# Patient Record
Sex: Male | Born: 1980 | Race: Black or African American | Hispanic: No | Marital: Married | Smoking: Never smoker
Health system: Southern US, Community
[De-identification: ages and names within clinical notes are randomized; demographics above are authoritative.]

## PROBLEM LIST (undated history)

## (undated) DIAGNOSIS — I10 Essential (primary) hypertension: Secondary | ICD-10-CM

## (undated) DIAGNOSIS — R569 Unspecified convulsions: Secondary | ICD-10-CM

## (undated) DIAGNOSIS — L03116 Cellulitis of left lower limb: Secondary | ICD-10-CM

## (undated) HISTORY — PX: NO PAST SURGERIES: SHX2092

## (undated) HISTORY — DX: Essential (primary) hypertension: I10

## (undated) HISTORY — DX: Cellulitis of left lower limb: L03.116

---

## 2004-05-14 ENCOUNTER — Emergency Department (HOSPITAL_COMMUNITY): Admission: EM | Admit: 2004-05-14 | Discharge: 2004-05-15 | Payer: Self-pay | Admitting: Emergency Medicine

## 2004-06-20 ENCOUNTER — Emergency Department (HOSPITAL_COMMUNITY): Admission: EM | Admit: 2004-06-20 | Discharge: 2004-06-21 | Payer: Self-pay | Admitting: Emergency Medicine

## 2004-06-25 ENCOUNTER — Emergency Department (HOSPITAL_COMMUNITY): Admission: EM | Admit: 2004-06-25 | Discharge: 2004-06-25 | Payer: Self-pay | Admitting: Emergency Medicine

## 2004-07-04 ENCOUNTER — Emergency Department (HOSPITAL_COMMUNITY): Admission: EM | Admit: 2004-07-04 | Discharge: 2004-07-04 | Payer: Self-pay | Admitting: Emergency Medicine

## 2004-07-24 ENCOUNTER — Emergency Department (HOSPITAL_COMMUNITY): Admission: EM | Admit: 2004-07-24 | Discharge: 2004-07-24 | Payer: Self-pay | Admitting: Emergency Medicine

## 2004-08-25 ENCOUNTER — Emergency Department (HOSPITAL_COMMUNITY): Admission: EM | Admit: 2004-08-25 | Discharge: 2004-08-25 | Payer: Self-pay | Admitting: Emergency Medicine

## 2004-10-29 ENCOUNTER — Emergency Department (HOSPITAL_COMMUNITY): Admission: EM | Admit: 2004-10-29 | Discharge: 2004-10-29 | Payer: Self-pay | Admitting: Emergency Medicine

## 2005-05-17 ENCOUNTER — Emergency Department (HOSPITAL_COMMUNITY): Admission: EM | Admit: 2005-05-17 | Discharge: 2005-05-18 | Payer: Self-pay | Admitting: Emergency Medicine

## 2005-08-05 ENCOUNTER — Emergency Department (HOSPITAL_COMMUNITY): Admission: EM | Admit: 2005-08-05 | Discharge: 2005-08-05 | Payer: Self-pay | Admitting: Emergency Medicine

## 2005-08-11 ENCOUNTER — Inpatient Hospital Stay (HOSPITAL_COMMUNITY): Admission: EM | Admit: 2005-08-11 | Discharge: 2005-08-12 | Payer: Self-pay | Admitting: *Deleted

## 2005-08-11 ENCOUNTER — Encounter: Payer: Self-pay | Admitting: Vascular Surgery

## 2005-10-24 ENCOUNTER — Emergency Department (HOSPITAL_COMMUNITY): Admission: EM | Admit: 2005-10-24 | Discharge: 2005-10-24 | Payer: Self-pay | Admitting: Emergency Medicine

## 2005-11-24 ENCOUNTER — Inpatient Hospital Stay (HOSPITAL_COMMUNITY): Admission: EM | Admit: 2005-11-24 | Discharge: 2005-11-25 | Payer: Self-pay | Admitting: Emergency Medicine

## 2005-12-26 ENCOUNTER — Emergency Department (HOSPITAL_COMMUNITY): Admission: EM | Admit: 2005-12-26 | Discharge: 2005-12-27 | Payer: Self-pay | Admitting: Emergency Medicine

## 2006-01-16 ENCOUNTER — Emergency Department (HOSPITAL_COMMUNITY): Admission: EM | Admit: 2006-01-16 | Discharge: 2006-01-16 | Payer: Self-pay | Admitting: Emergency Medicine

## 2006-02-10 ENCOUNTER — Emergency Department (HOSPITAL_COMMUNITY): Admission: EM | Admit: 2006-02-10 | Discharge: 2006-02-11 | Payer: Self-pay | Admitting: Emergency Medicine

## 2006-03-21 ENCOUNTER — Inpatient Hospital Stay (HOSPITAL_COMMUNITY): Admission: EM | Admit: 2006-03-21 | Discharge: 2006-03-23 | Payer: Self-pay | Admitting: Emergency Medicine

## 2006-03-21 ENCOUNTER — Emergency Department (HOSPITAL_COMMUNITY): Admission: EM | Admit: 2006-03-21 | Discharge: 2006-03-22 | Payer: Self-pay | Admitting: Emergency Medicine

## 2006-04-09 ENCOUNTER — Emergency Department (HOSPITAL_COMMUNITY): Admission: EM | Admit: 2006-04-09 | Discharge: 2006-04-09 | Payer: Self-pay | Admitting: Emergency Medicine

## 2006-07-28 ENCOUNTER — Emergency Department (HOSPITAL_COMMUNITY): Admission: EM | Admit: 2006-07-28 | Discharge: 2006-07-28 | Payer: Self-pay | Admitting: Emergency Medicine

## 2006-08-28 ENCOUNTER — Emergency Department (HOSPITAL_COMMUNITY): Admission: EM | Admit: 2006-08-28 | Discharge: 2006-08-28 | Payer: Self-pay | Admitting: Emergency Medicine

## 2007-01-07 ENCOUNTER — Emergency Department (HOSPITAL_COMMUNITY): Admission: EM | Admit: 2007-01-07 | Discharge: 2007-01-07 | Payer: Self-pay | Admitting: Emergency Medicine

## 2007-01-08 ENCOUNTER — Emergency Department (HOSPITAL_COMMUNITY): Admission: EM | Admit: 2007-01-08 | Discharge: 2007-01-08 | Payer: Self-pay

## 2007-02-09 ENCOUNTER — Ambulatory Visit: Payer: Self-pay | Admitting: Vascular Surgery

## 2007-02-09 ENCOUNTER — Inpatient Hospital Stay (HOSPITAL_COMMUNITY): Admission: EM | Admit: 2007-02-09 | Discharge: 2007-02-13 | Payer: Self-pay | Admitting: *Deleted

## 2007-02-09 ENCOUNTER — Encounter (INDEPENDENT_AMBULATORY_CARE_PROVIDER_SITE_OTHER): Payer: Self-pay | Admitting: Emergency Medicine

## 2007-02-09 ENCOUNTER — Ambulatory Visit: Payer: Self-pay | Admitting: Family Medicine

## 2007-02-15 ENCOUNTER — Telehealth: Payer: Self-pay | Admitting: *Deleted

## 2007-02-22 ENCOUNTER — Telehealth (INDEPENDENT_AMBULATORY_CARE_PROVIDER_SITE_OTHER): Payer: Self-pay | Admitting: Family Medicine

## 2007-02-22 ENCOUNTER — Telehealth: Payer: Self-pay | Admitting: Family Medicine

## 2007-02-23 ENCOUNTER — Ambulatory Visit: Payer: Self-pay | Admitting: Internal Medicine

## 2007-02-23 DIAGNOSIS — I82409 Acute embolism and thrombosis of unspecified deep veins of unspecified lower extremity: Secondary | ICD-10-CM | POA: Insufficient documentation

## 2007-02-23 LAB — CONVERTED CEMR LAB: INR: 1.2

## 2007-02-26 ENCOUNTER — Ambulatory Visit: Payer: Self-pay | Admitting: Family Medicine

## 2007-03-06 ENCOUNTER — Ambulatory Visit: Payer: Self-pay | Admitting: Family Medicine

## 2007-03-06 DIAGNOSIS — G40909 Epilepsy, unspecified, not intractable, without status epilepticus: Secondary | ICD-10-CM

## 2007-03-16 ENCOUNTER — Ambulatory Visit: Payer: Self-pay | Admitting: Family Medicine

## 2007-03-16 LAB — CONVERTED CEMR LAB: INR: 1.8

## 2007-03-26 ENCOUNTER — Emergency Department (HOSPITAL_COMMUNITY): Admission: EM | Admit: 2007-03-26 | Discharge: 2007-03-26 | Payer: Self-pay | Admitting: Emergency Medicine

## 2007-03-30 ENCOUNTER — Encounter (INDEPENDENT_AMBULATORY_CARE_PROVIDER_SITE_OTHER): Payer: Self-pay | Admitting: *Deleted

## 2007-05-30 ENCOUNTER — Emergency Department (HOSPITAL_COMMUNITY): Admission: EM | Admit: 2007-05-30 | Discharge: 2007-05-30 | Payer: Self-pay | Admitting: Emergency Medicine

## 2007-06-27 ENCOUNTER — Emergency Department (HOSPITAL_COMMUNITY): Admission: EM | Admit: 2007-06-27 | Discharge: 2007-06-27 | Payer: Self-pay | Admitting: Emergency Medicine

## 2007-07-10 ENCOUNTER — Inpatient Hospital Stay (HOSPITAL_COMMUNITY): Admission: EM | Admit: 2007-07-10 | Discharge: 2007-07-13 | Payer: Self-pay | Admitting: Emergency Medicine

## 2007-07-31 ENCOUNTER — Emergency Department (HOSPITAL_COMMUNITY): Admission: EM | Admit: 2007-07-31 | Discharge: 2007-07-31 | Payer: Self-pay | Admitting: Emergency Medicine

## 2007-08-01 ENCOUNTER — Emergency Department (HOSPITAL_COMMUNITY): Admission: EM | Admit: 2007-08-01 | Discharge: 2007-08-02 | Payer: Self-pay | Admitting: Emergency Medicine

## 2007-08-03 ENCOUNTER — Emergency Department (HOSPITAL_COMMUNITY): Admission: EM | Admit: 2007-08-03 | Discharge: 2007-08-04 | Payer: Self-pay | Admitting: Emergency Medicine

## 2007-08-06 ENCOUNTER — Emergency Department (HOSPITAL_COMMUNITY): Admission: EM | Admit: 2007-08-06 | Discharge: 2007-08-07 | Payer: Self-pay | Admitting: Emergency Medicine

## 2007-08-22 ENCOUNTER — Emergency Department (HOSPITAL_COMMUNITY): Admission: EM | Admit: 2007-08-22 | Discharge: 2007-08-22 | Payer: Self-pay | Admitting: Emergency Medicine

## 2007-08-24 ENCOUNTER — Emergency Department (HOSPITAL_COMMUNITY): Admission: EM | Admit: 2007-08-24 | Discharge: 2007-08-24 | Payer: Self-pay | Admitting: Emergency Medicine

## 2007-08-24 ENCOUNTER — Encounter (INDEPENDENT_AMBULATORY_CARE_PROVIDER_SITE_OTHER): Payer: Self-pay | Admitting: Emergency Medicine

## 2007-08-24 ENCOUNTER — Ambulatory Visit: Payer: Self-pay | Admitting: Vascular Surgery

## 2007-09-10 ENCOUNTER — Emergency Department (HOSPITAL_COMMUNITY): Admission: EM | Admit: 2007-09-10 | Discharge: 2007-09-10 | Payer: Self-pay | Admitting: Emergency Medicine

## 2007-12-30 ENCOUNTER — Emergency Department (HOSPITAL_COMMUNITY): Admission: EM | Admit: 2007-12-30 | Discharge: 2007-12-31 | Payer: Self-pay | Admitting: Emergency Medicine

## 2007-12-30 ENCOUNTER — Emergency Department (HOSPITAL_COMMUNITY): Admission: EM | Admit: 2007-12-30 | Discharge: 2007-12-30 | Payer: Self-pay | Admitting: Emergency Medicine

## 2007-12-31 ENCOUNTER — Inpatient Hospital Stay (HOSPITAL_COMMUNITY): Admission: EM | Admit: 2007-12-31 | Discharge: 2008-01-02 | Payer: Self-pay | Admitting: Emergency Medicine

## 2008-01-24 ENCOUNTER — Emergency Department (HOSPITAL_COMMUNITY): Admission: EM | Admit: 2008-01-24 | Discharge: 2008-01-25 | Payer: Self-pay | Admitting: Emergency Medicine

## 2008-02-12 ENCOUNTER — Emergency Department (HOSPITAL_COMMUNITY): Admission: EM | Admit: 2008-02-12 | Discharge: 2008-02-12 | Payer: Self-pay | Admitting: Emergency Medicine

## 2008-02-25 ENCOUNTER — Emergency Department (HOSPITAL_COMMUNITY): Admission: EM | Admit: 2008-02-25 | Discharge: 2008-02-25 | Payer: Self-pay | Admitting: Emergency Medicine

## 2008-03-12 ENCOUNTER — Emergency Department (HOSPITAL_COMMUNITY): Admission: EM | Admit: 2008-03-12 | Discharge: 2008-03-12 | Payer: Self-pay | Admitting: Emergency Medicine

## 2008-03-28 ENCOUNTER — Emergency Department (HOSPITAL_COMMUNITY): Admission: EM | Admit: 2008-03-28 | Discharge: 2008-03-28 | Payer: Self-pay | Admitting: Emergency Medicine

## 2008-04-09 ENCOUNTER — Emergency Department (HOSPITAL_COMMUNITY): Admission: EM | Admit: 2008-04-09 | Discharge: 2008-04-09 | Payer: Self-pay | Admitting: Emergency Medicine

## 2008-04-29 ENCOUNTER — Emergency Department (HOSPITAL_COMMUNITY): Admission: EM | Admit: 2008-04-29 | Discharge: 2008-04-30 | Payer: Self-pay | Admitting: Emergency Medicine

## 2008-05-06 ENCOUNTER — Emergency Department (HOSPITAL_COMMUNITY): Admission: EM | Admit: 2008-05-06 | Discharge: 2008-05-06 | Payer: Self-pay | Admitting: Emergency Medicine

## 2008-06-16 ENCOUNTER — Inpatient Hospital Stay (HOSPITAL_COMMUNITY): Admission: EM | Admit: 2008-06-16 | Discharge: 2008-06-18 | Payer: Self-pay | Admitting: Emergency Medicine

## 2008-08-11 ENCOUNTER — Emergency Department (HOSPITAL_COMMUNITY): Admission: AC | Admit: 2008-08-11 | Discharge: 2008-08-12 | Payer: Self-pay

## 2008-10-19 ENCOUNTER — Emergency Department (HOSPITAL_COMMUNITY): Admission: EM | Admit: 2008-10-19 | Discharge: 2008-10-19 | Payer: Self-pay | Admitting: Emergency Medicine

## 2008-11-21 ENCOUNTER — Emergency Department (HOSPITAL_COMMUNITY): Admission: EM | Admit: 2008-11-21 | Discharge: 2008-11-21 | Payer: Self-pay | Admitting: Emergency Medicine

## 2008-12-08 ENCOUNTER — Emergency Department (HOSPITAL_COMMUNITY): Admission: EM | Admit: 2008-12-08 | Discharge: 2008-12-08 | Payer: Self-pay | Admitting: Emergency Medicine

## 2009-09-07 ENCOUNTER — Emergency Department (HOSPITAL_COMMUNITY): Admission: EM | Admit: 2009-09-07 | Discharge: 2009-09-08 | Payer: Self-pay | Admitting: Emergency Medicine

## 2010-06-04 ENCOUNTER — Emergency Department (HOSPITAL_COMMUNITY)
Admission: EM | Admit: 2010-06-04 | Discharge: 2010-06-04 | Payer: Self-pay | Source: Home / Self Care | Admitting: Emergency Medicine

## 2010-06-07 NOTE — Consult Note (Signed)
NAME:  Calvin, [name] NO.:  [id number]  MEDICAL RECORD NO.:  [id number]          PATIENT TYPE:  EMS  LOCATION:  ED                           FACILITY:  [facility]  PHYSICIAN:  Marlan Palau, M.D.  DATE OF BIRTH:  06/22/1980  DATE OF CONSULTATION:  06/04/2010 DATE OF DISCHARGE:                                CONSULTATION   This is a Neurology emergency room consult note on the patient done on June 04, 2010.  HISTORY OF PRESENT ILLNESS:  Calvin [name] is a 30 year old right-handed black male, born [birth year] with a history of intractable seizures.  This patient has been on several medications in the past for seizures that include Dilantin, Depakote, and carbamazepine.  The patient had been on Keppra as well but was unable to afford the medication and went off the drug.  The patient has been on carbamazepine since February 2010 and had done fairly well on the drug.  The patient had a seizure in July 2011 when he ran out of the medication for a couple of days.  The patient is on 200 mg twice daily.  The patient had not recently missed a dose, but was at work today and had onset of a seizure with generalized jerking.  The patient was brought in for an evaluation.  The patient had been operating a motor vehicle.  The patient claims that he has had blood levels checked of carbamazepine through our office, but I do not see that this is the case.  Blood work has been checked in the emergency room but the results are pending.  PAST MEDICAL HISTORY:  Significant for: 1. History of seizures order with recent recurrence. 2. Medical noncompliance. 3. History of DVT in left leg. 4. Hypertension. 5. History of rhabdomyolysis associated with seizure.  The patient currently is on carbamazepine 200 mg twice daily.  The patient claims that he is not on any other medications at this time. The patient has no known allergies.  Does not smoke or drink.  SOCIAL HISTORY:   [place]; single; does currently work for [employer], lives with a friend.  The patient has no children.  FAMILY MEDICAL HISTORY:  Mother is alive and well.  Father died following a motor vehicle accident.  The patient has three sisters.  No family history of seizures is noted.  REVIEW OF SYSTEMS:  Notable for no recent fevers, chills.  The patient denies headache, neck pain, shortness of breath, chest pains, nausea, vomiting, difficulty controlling the bowels or bladder.  The patient was feeling well otherwise prior to the seizure today.  PHYSICAL EXAMINATION:  VITALS SIGNS:  Blood pressure 116/62, heart rate 98, respiratory rate 20, temperature afebrile. GENERAL:  The patient is a fairly well-developed black male who is alert and cooperative to time and examination. HEENT:  Head is atraumatic.  EYES:  Pupils are equal, round and react to light.  Disks are flat bilaterally. NECK:  Supple.  No carotid bruits noted.  Rest of examination is clear. CARDIOVASCULAR:  Regular rate and rhythm.  No obvious murmurs  or rubs noted. EXTREMITIES:  Without significant edema. NEUROLOGIC:  Cranial nerves as above.  Facial symmetry is present. Patient has full extraocular movements.  No significant nystagmus is seen with end gaze bilaterally.  Visual fields are full.  Speech is normal.  No laceration of the tongue is noted.  The patient has good strength in all fours, has good finger-nose-finger and toe-to-finger bilaterally.  Gait was not tested.  Deep tendon reflexes symmetric and normal, strength is normal throughout.  No drift is seen on the arms.  LABORATORY VALUES:  Unavailable at this time.  Tegretol level is pending.  IMPRESSION:  History of chronic seizure disorder with recent recurrence.  Need to get the carbamazepine levels and readjust the dose accordingly. The patient is not to drive until further notice and will need to  follow- up through our office.  If possible, the carbamazepine levels can be adjusted upwards.  I do not see this patient had a carbamazepine level checked since he has been on the Tegretol.     Marlan Palau, M.D.     CKW/MEDQ  D:  06/04/2010  T:  06/04/2010  Job:  [id number]  cc:   [facility] [address] [place]  Electronically Signed by Thana Farr M.D. on 06/07/2010 07:47:54 AM

## 2010-07-05 ENCOUNTER — Emergency Department (HOSPITAL_COMMUNITY)
Admission: EM | Admit: 2010-07-05 | Discharge: 2010-07-05 | Disposition: A | Discharge status: Home / Self Care | Payer: Self-pay | Attending: Emergency Medicine | Admitting: Emergency Medicine

## 2010-07-05 DIAGNOSIS — G40909 Epilepsy, unspecified, not intractable, without status epilepticus: Secondary | ICD-10-CM | POA: Insufficient documentation

## 2010-08-15 LAB — CARBAMAZEPINE LEVEL, TOTAL: Carbamazepine Lvl: <2 ug/mL — ABNORMAL LOW (ref 4.0–12.0)

## 2010-08-15 LAB — BASIC METABOLIC PANEL
CO2: 21 mEq/L (ref 19–32)
Chloride: 107 mEq/L (ref 96–112)
GFR calc Af Amer: >60 mL/min (ref >60)
Potassium: 4.4 mEq/L (ref 3.5–5.1)
Sodium: 139 mEq/L (ref 135–145)

## 2010-08-24 LAB — URINALYSIS, ROUTINE W REFLEX MICROSCOPIC
Glucose, UA: NEGATIVE mg/dL
Ketones, ur: NEGATIVE mg/dL
Leukocytes, UA: NEGATIVE
Nitrite: NEGATIVE
Specific Gravity, Urine: 1.011 (ref 1.005–1.030)
pH: 5 (ref 5.0–8.0)

## 2010-08-24 LAB — CBC
HCT: 36.5 % — ABNORMAL LOW (ref 39.0–52.0)
HCT: 46.3 % (ref 39.0–52.0)
Hemoglobin: 12.4 g/dL — ABNORMAL LOW (ref 13.0–17.0)
Hemoglobin: 15.3 g/dL (ref 13.0–17.0)
MCV: 90.4 fL (ref 78.0–100.0)
MCV: 92.9 fL (ref 78.0–100.0)
Platelets: 169 10*3/uL (ref 150–400)
Platelets: 230 10*3/uL (ref 150–400)
RBC: 4.41 MIL/uL (ref 4.22–5.81)
RDW: 12.2 % (ref 11.5–15.5)
RDW: 12.7 % (ref 11.5–15.5)
WBC: 5.5 10*3/uL (ref 4.0–10.5)
WBC: 7.3 10*3/uL (ref 4.0–10.5)

## 2010-08-24 LAB — DIFFERENTIAL
Basophils Relative: 0 % (ref 0–1)
Eosinophils Absolute: 0 10*3/uL (ref 0.0–0.7)
Eosinophils Relative: 0 % (ref 0–5)
Monocytes Absolute: 1.1 10*3/uL — ABNORMAL HIGH (ref 0.1–1.0)
Neutro Abs: 3.1 10*3/uL (ref 1.7–7.7)

## 2010-08-24 LAB — BLOOD GAS, ARTERIAL
Bicarbonate: 24.8 mEq/L — ABNORMAL HIGH (ref 20.0–24.0)
O2 Saturation: 88.7 %
Patient temperature: 98.9
TCO2: 22 mmol/L (ref 0–100)
pH, Arterial: 7.364 (ref 7.350–7.450)
pO2, Arterial: 61.4 mmHg — ABNORMAL LOW (ref 80.0–100.0)

## 2010-08-24 LAB — RAPID URINE DRUG SCREEN, HOSP PERFORMED
Barbiturates: NOT DETECTED
Cocaine: NOT DETECTED
Opiates: NOT DETECTED

## 2010-08-24 LAB — BASIC METABOLIC PANEL
BUN: 13 mg/dL (ref 6–23)
BUN: 9 mg/dL (ref 6–23)
Calcium: 8.5 mg/dL (ref 8.4–10.5)
Calcium: 8.6 mg/dL (ref 8.4–10.5)
Chloride: 106 mEq/L (ref 96–112)
Chloride: 109 mEq/L (ref 96–112)
Creatinine, Ser: 1.11 mg/dL (ref 0.4–1.5)
GFR calc Af Amer: >60 mL/min (ref >60)
GFR calc Af Amer: >60 mL/min (ref >60)
GFR calc Af Amer: >60 mL/min (ref >60)
GFR calc non Af Amer: >60 mL/min (ref >60)
GFR calc non Af Amer: >60 mL/min (ref >60)
Glucose, Bld: 97 mg/dL (ref 70–99)
Potassium: 3.5 mEq/L (ref 3.5–5.1)
Potassium: 4 mEq/L (ref 3.5–5.1)
Sodium: 142 mEq/L (ref 135–145)
Sodium: 145 mEq/L (ref 135–145)

## 2010-08-24 LAB — T4, FREE: Free T4: 0.77 ng/dL — ABNORMAL LOW (ref 0.89–1.80)

## 2010-08-24 LAB — URINE MICROSCOPIC-ADD ON

## 2010-08-24 LAB — CK
Total CK: 1199 U/L — ABNORMAL HIGH (ref 7–232)
Total CK: 967 U/L — ABNORMAL HIGH (ref 7–232)

## 2010-08-24 LAB — ETHANOL: Alcohol, Ethyl (B): <5 mg/dL (ref 0–10)

## 2010-08-24 LAB — TSH: TSH: 0.2 u[IU]/mL — ABNORMAL LOW (ref 0.350–4.500)

## 2010-09-21 NOTE — Discharge Summary (Signed)
NAME:  Calvin [name], Calvin [name]               ACCOUNT NO.:  [id number]   MEDICAL RECORD NO.:  [id number]          PATIENT TYPE:  INP   LOCATION:  5017                         FACILITY:  [facility]   PHYSICIAN:  Wayne A. Sheffield Slider, M.D.    DATE OF BIRTH:  [birth year]   DATE OF ADMISSION:  02/09/2007  DATE OF DISCHARGE:  02/13/2007                               DISCHARGE SUMMARY   PRIMARY CARE Dashel Goines:  Consulting civil engineer [facility].   DISCHARGE DIAGNOSES:  1. Deep vein thrombosis of the left lower extremity.  2. Seizure disorder.  3. Cellulitis of the left lower extremity.   DISCHARGE MEDICATIONS:  1. Trimethoprim Sulfamethoxazole double strength 160/80 two pills      twice daily x7 days.  2. Warfarin take as directed at INR check.  3. Depakote ER 500 mg daily.  4. Tegretol XR 400 mg twice daily.   CONSULTS:  Pharmacy anticoagulation.   PROCEDURE:  Lower extremity Doppler on February 09, 2007.   LABORATORY DATA:  INR on February 09, 2007 was 1.1.  On February 10, 2007,  1.3.  On February 11, 2007, 1.7.  On February 12, 2007, it was 2.6.  On  February 13, 2007, the day of discharge, the INR was 2.9 with a  prothrombin time of 31.3.  CBC on discharge was:  White blood count 5.4,  hemoglobin 13.4, hematocrit 39.5, platelets 188,000.  Blood cultures  showed no growth.  Valproic acid level on February 09, 2007 was 15.4.  Tegretol level on February 09, 2007 was 6.3.  These were both drawn at  2125 in the evening.  Antithrombin 100, this was on February 09, 2007.   BRIEF HOSPITAL COURSE:  This is a 30 year old male with a past medical  history of seizure disorder admitted on February 09, 2007 for acute left  DVT with no known precipitating factors.  1. For left lower extremity DVT, he was started on warfarin and      bridged to anticoagulation therapy with Lovenox.  Initial doses      were at 10 mg of warfarin per day and on discharge were 4 mg per      day.  He had no signs or symptoms of pulmonary embolism.  INR was      checked daily and adjusted, and warfarin dose was adjusted to an      INR of 2 to 3.  2. Cellulitis.  The patient was found to have a left lower leg      cellulitis and was stared on vancomycin as an inpatient.  He      received 3 days of vancomycin.  It was discontinued and TMP/FMX was      started on February 12, 2007 to prepare the patient for discharge.      The patient will be discharged on this medication.  Pharmacy had      some concern that this antibiotic in combination with warfarin may      cause supratherapeutic INR.  Therefore, close followup as an      outpatient is important.  They did not consider  this an absolute      contraindication.  3. Seizure disorder.  Continue his Tegretol and Depakote at home      levels.  His Depakote level was low over the weekend.  He will be      scheduled for followup with [facility] as an outpatient.      We will not adjust his seizure meds at this time.   DISCHARGE INSTRUCTIONS:  The patient is discharged with no restrictions  on diet or activity, and is asked to elevate his left leg as much as  possible.  He is to return to the [facility] for  an INR check tomorrow morning anytime during regular business hours  between 8:30 and 5 p.m.  He is also to followup with Dr. Madlyn Frankel.  Constance Goltz at the [facility] on February 22, 2007 at  1:30 p.m.  Followup is currently being arranged with Dr. Pearlean Brownie at  [facility] at phone number [phone].   The patient was discharged to home in stable medical condition.      Romero Belling, MD  Electronically Signed      Arnette Norris. Sheffield Slider, M.D.  Electronically Signed    MO/MEDQ  D:  02/13/2007  T:  02/13/2007  Job:  [id number]

## 2010-09-21 NOTE — Consult Note (Signed)
NAME:  Calvin [name], Calvin [name]               ACCOUNT NO.:  [id number]   MEDICAL RECORD NO.:  [id number]          PATIENT TYPE:  INP   LOCATION:  1424                         FACILITY:  [facility]   PHYSICIAN:  Catherine A. Orlin Hilding, M.D.DATE OF BIRTH:  1981-03-20   DATE OF CONSULTATION:  07/11/2007  DATE OF DISCHARGE:                                 CONSULTATION   REASON FOR CONSULTATION:  Breakthrough seizures.   HISTORY OF PRESENT ILLNESS:  Calvin [name] is a 30 year old African  American male with a history of seizure disorder since age 38.  It sounds  as though his seizures were initially felt to be focal with head  deviation to the right and staring spells that progressed into a  generalized tonic-clonic seizures as well.  Apparently his workup was  negative for any clear underlying cause.  He was initially started on  Depakote and it seemed to work initially and he was seizure free for 5  years, and then the Depakote was stopped.  Then the seizures recurred 5  years ago.  He was started on Dilantin; that seemed to make the seizures  worse.  He was switched to Tegretol.  Dr. Pearlean Brownie then started him on  Depakote and I gather the plan was to perhaps transition him from the  Tegretol to Depakote altogether.  He was seen last by nurse practitioner  Daphine Deutscher on February 19, 2007.  His Depakote was increased to 1000 mg.  He  was supposed to continue his Tegretol twice a day at that time.  He was  going to have his medication adjusted and was to follow up in 6 months  in April.  As I can gather, it would appear that the plan was to  increase his Depakote to therapeutic levels and then perhaps discontinue  the Tegretol.  He has been in and out of the emergency room with  seizures since then and sometimes with therapeutic levels of Tegretol  and sometimes not, although nobody has checked a Depakote level as far  as I can see.  He has been seen in the emergency room November 2008,  January 2008, and February  2008 with seizures.  Today he presented with  several seizures yesterday.  He had several in the emergency room and  had to be admitted.  At that time it was discovered that he had stopped  taking both seizure medicines for a week because he had lost  confidence in the medicines.  Depakote level was less than 10.  Tegretol less than 2.  He was loaded with a gram of Dilantin and given  some Ativan, and I am asked to see him for advice.   REVIEW OF SYSTEMS:  A 12- system review is positive for the seizure,  tongue pain with a tongue laceration.  Otherwise negative.   PAST MEDICAL HISTORY:  Significant for these idiopathic seizures,  presumably with a workup elsewhere with EEG and MRI scan of the brain;  everything was normal apparently.  There is no history of meningitis,  head trauma or childhood injury.  He does have a history of  DVT recently  and he was put on Coumadin for a time.  That has been discontinued.  Denies any other medical problems.   MEDICATIONS:  Are supposed to include Tegretol XR 40 mg twice a day and  Depakote ER 5 mg 2 at night.   ALLERGIES:  NO KNOWN DRUG ALLERGIES.   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   SOCIAL HISTORY:  No alcohol.  No smoking.  He is not driving because of  the seizure disorder.   FAMILY HISTORY:  Negative for seizures, according to the patient.   OBJECTIVE:  On exam temperature is 97.8, pulse 65, respirations 20, BP  is 106/54, 100% sat on 2 liters.  NECK:  Supple without bruits.  HEART:  Regular rate and rhythm.  He had a tongue laceration to the  left.  NEUROLOGIC EXAM:  He is somewhat drowsy.  Is probably a combination of  postictal state and sedation with lorazepam and Dilantin.  However, he  is otherwise alert.  The exam goes on and he is oriented, with normal  language.  CRANIAL NERVES:  Pupils are equal and reactive.  Visual fields are full.  Extraocular movements are intact.  Facial  sensation is normal.  Facial  motor activity is normal.  Hearing is intact.  Palate symmetric and  tongue is midline with laceration on the left.  On motor exam there is  no drift.  He has normal bulk, tone and strength with 5/5 strength  throughout.  Reflexes are 2+ in the lower extremities, trace in the  upper extremities.  No asymmetry.  He has downgoing toes to plantar  stimulation.  COORDINATION:  Finger-to-nose and heel-to-shin are intact.  Sensory is intact bilaterally.   LABS:  Included the subtherapeutic Depakote which is less than 10 and  Tegretol which is less than 2.  Liver function panel is normal.  BMET is  normal except for a glucose of 108.  CBC is normal except for a mild  elevation of the white blood cells, which could be demargination from  his seizure.  Urine drug screen was negative.   ASSESSMENT:  Seizure disorder with breakthrough seizures secondary to  noncompliance.  He has is also had several breakthrough seizures with  variable compliance.   RECOMMENDATIONS:  Will change his regimen.  I want him to restart the  Depakote ER 5 mg 2 q.h.s.  Will begin Keppra 500 b.i.d. instead of the  Tegretol.  He can be discharged in the morning.  Should follow up with  Dr. Pearlean Brownie and nurse practitioner Daphine Deutscher as soon as possible upon  discharge.      Catherine A. Orlin Hilding, M.D.  Electronically Signed     CAW/MEDQ  D:  07/11/2007  T:  07/11/2007  Job:  [id number]

## 2010-09-21 NOTE — Discharge Summary (Signed)
NAME:  Calvin [name], Calvin [name]               ACCOUNT NO.:  [id number]   MEDICAL RECORD NO.:  [id number]          PATIENT TYPE:  INP   LOCATION:  1424                         FACILITY:  [facility]   PHYSICIAN:  Mobolaji B. Bakare, M.D.DATE OF BIRTH:  01-11-1981   DATE OF ADMISSION:  07/10/2007  DATE OF DISCHARGE:  07/13/2007                               DISCHARGE SUMMARY   PRIMARY CARE PHYSICIAN:  Unassigned.   NEUROLOGIST:  Dr. Pearlean Brownie.   FINAL DIAGNOSIS:  1. Seizures secondary to noncompliance.  2. Hematuria secondary to traumatic Foley catheterization.   CONSULTANTS:  Neurology consult provided by Dr. Orlin Hilding.   PROCEDURES:  Head CT scan done on July 10, 2007 showed no acute  intracranial abnormality.  Chest x-ray done on July 11, 2007 showed no  acute findings.   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  His seizures have not really been  controlled. Most of his issues border around insurance coverage.   The patient has had three emergency room visit for seizures since  January 200, first on January 21 and on February 18 with seizures.  He  tells me that he had been compliant with these medications up until that  time. His Tegretol level on May 30, 2007 was within normal range of  6.5. He is supposed to be on Tegretol 400 mg p.o. b.i.d. and Depakote  500 mg q.h.s. (ER).  The patient became despondent and decided he was  not going to continue his medications because it was not helping him at  all. A week prior to hospitalization, he quit using all his medications.  He presented to the emergency room with three episodes of seizures on  the day of admission.  The patient was loaded with Dilantin in the  emergency room and admitted for further stabilization and management.   On arrival in the emergency room, urine drug screen was essentially  negative for drugs of abuse. Valproic acid was less than 10 and Tegretol  level was  less than 2.0.   HOSPITAL COURSE:  He was seen in consultation by Dr. Orlin Hilding. After she  reviewed the patient's data from [facility], his  regimen was changed to Depakote ER 500 mg 2 tablets q.h.s. and Keppra  500 mg b.i.d. was started and Tegretol was discontinued.  He did not  have any further seizures.   The patient developed hematuria after the Foley catheter was inserted in  the emergency room.  The Foley was therefore discontinued.  Despite this  hematuria lingered and he was given IV fluids to help with diuresis. The  hematuria cleared at the time of discharge.  Hemoglobin was within  normal.   DISCHARGE MEDICATIONS:  1. Depakote ER 500 mg 2 at night.  2. Keppra 500 mg 1 tablet b.i.d.   LABORATORY DATA AT DISCHARGE:  Sodium 142, potassium 3.9, chloride 108,  bicarb 29, BUN 7, creatinine 0.99, glucose 91, calcium 8.6, TSH  0.233,  free T4 1.22, T3 2.5.  Total bilirubin of 0.9 , indirect bilirubin 0.8,  alkaline phosphatase 69, AST 26, ALT 19, total protein 7.2, albumin 4.3.   FOLLOW UP:  Follow up with Dr. Pearlean Brownie in 1-2 weeks.      Mobolaji B. Corky Downs, M.D.  Electronically Signed     MBB/MEDQ  D:  07/13/2007  T:  07/14/2007  Job:  [id number]   cc:   Pramod P. Pearlean Brownie, MD  Fax: [phone]

## 2010-09-21 NOTE — H&P (Signed)
NAME:  Calvin [name], Calvin [name]               ACCOUNT NO.:  [id number]   MEDICAL RECORD NO.:  [id number]          PATIENT TYPE:  INP   LOCATION:  1410                         FACILITY:  [facility]   PHYSICIAN:  Della Goo, M.D. DATE OF BIRTH:  03/16/1981   DATE OF ADMISSION:  06/16/2008  DATE OF DISCHARGE:                              HISTORY & PHYSICAL   CONTINUATION   ASSESSMENT:  A 30 year old male being admitted with:  1. Seizures.  2. Questionable metabolic acidosis.  3. Medical noncompliance.   PLAN:  The patient will be admitted to the step-down ICU area for  continued monitoring.  He will be placed on seizure precautions.  The  patient will be loaded with IV Keppra and then continued on 500 mg IV q.  12.  An ABG will be ordered secondary to the abnormal findings on his  chemistry which revealed a CO2 level of 9.  Also an alcohol level will  be ordered and a urinalysis.  The patient will be n.p.o. while he is  obtunded.  He will be placed on DVT and GI prophylaxis as well.  A case  management evaluation will be requested to assist the patient with the  resources in the area for obtaining his regular medications.      Della Goo, M.D.  Electronically Signed     HJ/MEDQ  D:  06/17/2008  T:  06/18/2008  Job:  [id number]

## 2010-09-21 NOTE — Discharge Summary (Signed)
NAME:  Calvin [name], Calvin [name]               ACCOUNT NO.:  [id number]   MEDICAL RECORD NO.:  [id number]          PATIENT TYPE:  INP   LOCATION:  1410                         FACILITY:  [facility]   PHYSICIAN:  Hillery Aldo, M.D.   DATE OF BIRTH:  [birth year]   DATE OF ADMISSION:  06/16/2008  DATE OF DISCHARGE:  06/18/2008                               DISCHARGE SUMMARY   PRIMARY CARE PHYSICIAN:  None.   NEUROLOGIST:  Marlan Palau, M.D.   DISCHARGE DIAGNOSES:  1. Seizure.  2. Hypoxia.  3. Metabolic acidosis.  4. Rhabdomyolysis.  5. Lymphocytosis.  6. Acute renal insufficiency.  7. Hypertension.  8. Decreased TSH, free T4 pending.   DISCHARGE MEDICATIONS:  1. Tegretol 200 mg p.o. b.i.d.  2. Clonidine 0.1 mg b.i.d.   CONSULTATIONS:  Dr. Anne Hahn of neurology.   BRIEF ADMISSION HISTORY OF PRESENT ILLNESS:  The patient is a 30-year-  old male with a past medical history of seizure disorder who presented  to the hospital after a witnessed generalized tonic-clonic seizure.  The  patient has not been taking his antiseizure medications due to problems  affording the cost of the medications.  He was admitted for further  evaluation and treatment.  For the full details, please see the dictated  report done by Dr. Lovell Sheehan.   PROCEDURES AND DIAGNOSTIC STUDIES:  None.   DISCHARGE LABORATORY VALUES:  CK was 701.  Sodium was 142, potassium  4.0, chloride 114, bicarbonate 25, BUN 4, creatinine 0.97, glucose 97.  White blood cell count was 5.5, hemoglobin 12.4, hematocrit 36.5,  platelets 150.  TSH was 0.200.   HOSPITAL COURSE BY PROBLEM:  1. Seizure with hypoxia and metabolic acidosis:  The patient was      admitted and evaluated in the ICU for the first 24 hours.  The      patient did receive Ativan in the emergency department and      subsequently was started on IV Keppra.  Neurology consultation was      requested and kindly provided by Dr. Anne Hahn to make recommendations      based on  affordability and ease of use for the most appropriate      regimen for this patient who has had difficulty with compliance in      the past.  Recommendations were to initiate therapy with Tegretol.      The patient was subsequently started on Tegretol which has been      titrated up to 200 mg b.i.d.  He has been encouraged to follow up      with Dr. Anne Hahn to have his Tegretol level checked in 2-3 weeks.      The patient has been provided with Dr. Anne Hahn' his phone number and      directions to call his office to schedule a follow-up appointment.      Of note, the patient's metabolic acidosis and hypoxia has      completely resolved.  2. Rhabdomyolysis:  The patient received IV fluid hydration, and CK      levels have steadily come down.  He has  been encouraged to drink at      least eight glasses of water or fluids daily to keep his kidneys      flushed out.  3. Lymphocytosis:  The patient did have an absolute lymphocytosis      which was evaluated by pathology smearing.  Lymphocytosis was 62%      on the initial differential with an absolute lymphocyte count of 7.      The white blood cell count was elevated to 11.2 on that specific      study.  The patient's white blood cell count did revert to normal,      and no further workup was undertaken.  The lymphocytosis was felt      to be reactive, but suggestions are to do immunophenotyping if this      remains a persistent problem.  4. Acute renal insufficiency:  Resolved with IV fluid hydration.  5. Hypertension:  The patient was put on clonidine and this controls      his blood pressure well.  6. Low TSH:  The patient had a free T4 level added, and this is      pending at the time of this dictation.  He should follow up with      [facility] for outstanding results of his lab tests.   DISPOSITION:  The patient is medically stable and will be discharged  home.  We will attempt to get a supply of his medications through the  medication  assistance program.  We will set him up for hospital follow  up at [facility] with the help of the care manager.   Time spent coordinating care for discharge and discharge instructions  equals 35 minutes.      Hillery Aldo, M.D.  Electronically Signed     CR/MEDQ  D:  06/18/2008  T:  06/18/2008  Job:  [id number]   cc:   Marlan Palau, M.D.  Fax: [phone]

## 2010-09-21 NOTE — H&P (Signed)
NAME:  Calvin [name], Calvin [name] NO.:  [id number]   MEDICAL RECORD NO.:  [id number]          PATIENT TYPE:  INP   LOCATION:  1424                         FACILITY:  [facility]   PHYSICIAN:  Della Goo, M.D. DATE OF BIRTH:  [birth year]   DATE OF ADMISSION:  07/10/2007  DATE OF DISCHARGE:                              HISTORY & PHYSICAL   This is an unassigned patient.   CHIEF COMPLAINT:  Seizures.   HISTORY OF PRESENT ILLNESS:  This is a 30 year old male who was sent  emergently to the hospital after suffering a seizure while working  out/exercising.  The patient is unable to give a history at this time  because he is postictal and has suffered 2 subsequent seizures after  arrival.  The patient was evaluated by the EDP and administered  medications, and IV Dilantin therapy loading was also started.  The  patient was seen in the emergency department June 27, 2007.  Results  of his laboratory studies also revealed subtherapeutic levels of  Tegretol and valproic acid.   PAST MEDICAL HISTORY:  1. Significant for seizures.  2. Also history of a DVT of the left lower extremity in the past in      October 2008.   PAST SURGICAL HISTORY:  None.   MEDICATIONS:  1. Tegretol 400 mg 1 p.o. b.i.d.  2. Depakote 500 mg ER q.h.s.   ALLERGIES:  NO KNOWN DRUG ALLERGIES.   SOCIAL HISTORY:  The patient is a Consulting civil engineer at [employer].  He is a nonsmoker,  nondrinker and denies any illicit drug usage.   FAMILY HISTORY:  Negative for coronary artery disease, hypertension,  diabetes mellitus and cancer.   PHYSICAL EXAMINATION:  GENERAL:  This is a thin, well-nourished, well-  developed male who is currently sedated and postictal, who is in no  acute distress currently.  VITAL SIGNS:  Temperature 98.2, blood pressure 129/69, heart rate 96-  104, respirations 18, O2 saturation is 100% on room air.  HEENT:  Normocephalic, atraumatic.  Pupils are equally round and  reactive to light.   Extraocular muscles are intact.  Funduscopic benign.  Oropharynx, there is sanguineous exudate along the teeth, lower lip and  right lateral tongue area.  Otherwise, the oropharynx is clear.  NECK:  Supple.  No jugular venous distention, adenopathy or thyromegaly  present.  CARDIOVASCULAR:  Tachycardiac rate and rhythm.  No murmurs,  gallops or rubs.  LUNGS:  Clear to auscultation bilaterally.  ABDOMEN:  Positive bowel sounds, soft, nontender, nondistended.  EXTREMITIES:  Without cyanosis, clubbing or edema.  NEUROLOGIC:  The patient is currently sedated and postictal, unable to  assess neurologically at this time.   LABORATORY STUDIES:  Chemistry with a sodium of 142, potassium 3.6,  chloride 103, bicarb 6, BUN 19, creatinine 1.48 and glucose 131.  Pro-  time 16.6, INR 1.3, Tegretol level less than 2.0.  Valproic acid level  less than 10.0.  Urine drug screen negative.   DIAGNOSTICS:  A CT scan of the head pending.   ASSESSMENT:  1. This is a 30 year old male being admitted with seizures.  2.  Tongue laceration secondary to number 1.  3. Subtherapeutic medication levels.  4. Questionable compliance with medical therapy.   PLAN:  The patient will be admitted to telemetry area for monitoring.  He has been placed on seizure precautions.  The patient will be n.p.o.  while he is sedated and unresponsive.  IV fluids have been ordered for  rehydration and maintenance therapy.  The patient has been placed on IV  Dilantin therapy loading and will be placed on 100 mg IV q.8 h.  afterward.  DVT and GI prophylaxis have also been ordered.      Della Goo, M.D.  Electronically Signed     HJ/MEDQ  D:  07/10/2007  T:  07/11/2007  Job:  [id number]

## 2010-09-21 NOTE — H&P (Signed)
NAME:  Calvin [name], Calvin [name] NO.:  [id number]   MEDICAL RECORD NO.:  [id number]          PATIENT TYPE:  INP   LOCATION:  0110                         FACILITY:  [facility]   PHYSICIAN:  Isidor Holts, M.D.  DATE OF BIRTH:  March 12, 1981   DATE OF ADMISSION:  12/31/2007  DATE OF DISCHARGE:                              HISTORY & PHYSICAL   PRIMARY MEDICAL DOCTOR:  Unassigned.   PRIMARY NEUROLOGIST:  Dr. Janalyn Shy P. Sethi.   CHIEF COMPLAINTS:  Recurrent seizures for 2 days.   HISTORY OF PRESENT ILLNESS:  This is a 30 year old male, with known  history of generalized seizure disorder.  The patient is currently very  drowsy at the time of this evaluation, and so history is obtained  essentially from ED MD.  Reportedly, the patient was seen at the [facility] emergency department on December 30, 2007, for a generalized  seizure episode.  He was discharged after loading with IV Keppra, but  represented after approximately 1 hour, when he had another seizure  episode.  He was then observed overnight in the emergency department and  discharged in a.m. of December 31, 2007.  This p.m., however, the patient  was walking outside with a friend at [employer], when he dropped to the ground  and had a witnessed seizure episode lasting approximately 5 minutes.  He  was still postictal when EMS was called by his friends, and he was  brought to the emergency department via EMS.  En route, however, he had  yet another generalized seizure episode lasting approximately 1-1/2  minutes.   PAST MEDICAL HISTORY:  1. Seizure disorder.  2. Left lower extremity DVT November 2008.   MEDICATION HISTORY:  The patient is unable to state, however, review of  EMR reveals that the patient at the time of discharge in March 2009 was  on;  1. Depakote ER 1000 mg p.o. nightly.  2. Keppra 500 mg p.o. b.i.d.   ALLERGIES:  NO KNOWN DRUG ALLERGIES   REVIEW OF SYSTEMS:  Unobtainable.  However, there is no  antecedent  history of fever`, abdominal pain, vomiting or diarrhea.   SOCIAL HISTORY:  The patient is a Barista.  Nonsmoker,  nondrinker.  Has no history of drug abuse.   FAMILY HISTORY:  Noncontributory.   PHYSICAL EXAMINATION:  VITAL SIGNS:  Temperature 99.1, pulse 91 per  minute and regular, respiratory rate 17, BP 120/65 mmHg.  Pulse oximeter  100% on 2 liters of oxygen.  GENERAL:  The patient was very drowsy at the time of this evaluation,  although arousable to pain stimuli and vigorous tactile stimuli.  Not  short of breath at rest.  HEENT:  No clinical pallor, no jaundice.  No conjunctival injection.  Throat not visualized secondary to lack of cooperation.  NECK:  Supple.  JVP not seen.  No palpable lymphadenopathy.  No palpable  goiter.  CHEST:  Clinically clear to auscultation.  No wheezes or crackles.  CARDIOVASCULAR:  Heart sounds S1-S2 heard, normal, regular, no murmurs.  ABDOMEN:  Full, soft and nontender.  No palpable organomegaly.  No  palpable masses.  Normal bowel sounds.  LOWER EXTREMITY EXAMINATION:  Mild to moderate edema left lower  extremity.  No pitting edema right lower extremity.  MUSCULOSKELETAL SYSTEM:  Not fully examined.  CENTRAL NERVOUS SYSTEM:  Patient is moving all limbs.  However, more  full examination not feasible secondary to lack of cooperation.   INVESTIGATIONS:  CBC - WBC 11.0, hemoglobin 12.6, hematocrit 37.4,  platelets 122.  Electrolytes; sodium 144, potassium 3.7, chloride 110,  CO2 of 24, BUN 11, creatinine 1.36, glucose 81.  AST 27, ALT 16,  alkaline phosphatase 29.  Depakote 53.3, Lipitor less than 2.0.  Lipase  44.  Head CT scan dated December 31, 2007, shows no acute intracranial  findings.  CT cervical spine dated December 31, 2007, showed normal  alignment and no acute bony findings.  Chest CT angiogram dated December 31, 2007, showed no evidence of pulmonary embolism.  Bilateral lower  lobe infiltrates, left greater than  right.  A 12-lead EKG dated December 31, 2007, showed sinus rhythm regular at 85 per minute, normal axis, no  acute ischemic changes.   ASSESSMENT AND PLAN:  1. Recurrent seizures.  Patient convulsed in status this p.m.  In the      emergency department, he was loaded with intravenous Fosphenytoin      and is currently drowsy with no further seizures noted.  We shall      admit the patient to the ICU for close monitoring, do regular neuro      checks, continue pre-admission anticonvulsant medications, i.e.      Keppra and Depakote, albeit intravenously for now.  We shall change      Depakote to 500 mg q.a.m. and 1000 mg nightly.  Meanwhile, utilize      p.r.n. Ativan for breakthrough seizures ,and consult neurologist      for definitive management.   1. Aspiration.  This is likely secondary to #1 above.  The patient is      afebrile, not short of breath.  White cell count appears within      normal limits.  We shall cover with Zosyn, administer oxygen      supplementation and p.r.n. bronchodilator nebulizers.   Further management will depend on clinical course.      Isidor Holts, M.D.  Electronically Signed     CO/MEDQ  D:  12/31/2007  T:  12/31/2007  Job:  [id number]   cc:   Pramod P. Pearlean Brownie, MD  Fax: [phone]

## 2010-09-21 NOTE — Discharge Summary (Signed)
NAME:  Calvin [name], Calvin [name] NO.:  [id number]   MEDICAL RECORD NO.:  [id number]          PATIENT TYPE:  INP   LOCATION:  1510                         FACILITY:  [facility]   PHYSICIAN:  Isidor Holts, M.D.  DATE OF BIRTH:  08-20-1980   DATE OF ADMISSION:  12/31/2007  DATE OF DISCHARGE:  01/02/2008                               DISCHARGE SUMMARY   PRIMARY MD:  Gentry Fitz.   PRIMARY NEUROLOGIST:  Dr. Delia Heady.   DISCHARGE DIAGNOSES:  1. Recurrent seizures.  2. Aspiration pneumonitis.   DISCHARGE MEDICATIONS:  1. Depakote 500 mg p.o. q.a.m. and 1000 mg p.o. q.h.s.  2. Keppra 750 mg p.o. b.i.d.  3. Avelox 400 mg p.o. daily for 7 days, from January 03, 2008.   PROCEDURES.:  1. Chest x-ray dated December 30, 2007.  This was negative for acute      cardiopulmonary process.  2. Head CT scan dated December 31, 2007.  This showed no acute      intracranial findings and no mass lesions, no changes prior CT      examinations.  3. CT scan cervical spine dated December 31, 2007.  This showed normal      alignment and no acute bony findings.  4. Chest CT angiogram dated December 31, 2007.  This showed no CT      findings to suggest pulmonary emboli.  Bilateral lower lobe      infiltrates left greater than right, normal thoracic aorta.  5. Brain MRI dated January 01, 2008.  This showed no acute infarct.  No      abnormal intracranial enhancing lesion.  6. Chest x-ray dated January 02, 2008.  This showed slight increased      left lower lobe pneumonia.   CONSULTATIONS:  1. Dr. Levert Feinstein, neurologist.   ADMISSION HISTORY:  As in H and P notes of December 31, 2007.  However, in  brief, this is a 30 year old male, with known history of seizure  disorder and left lower extremity DVT November 2008, presenting with  recurrent seizure episodes over a 2-day period.  He was admitted for  further evaluation, investigation and management.   CLINICAL COURSE.:  1. Recurrent seizures.  The  patient was admitted to the intensive care      unit, after loading with intravenous Fosphenytoin, administered in      the emergency department.  He was then continued on p.r.n. Ativan      for possible breakthroughs, and his Depakote was increased to 500      mg q.a.m., 1000 mg q.h.s.  Urinalysis, and  blood cultures proved      to be negative.  Chest CT scan, done to rule out possible pulmonary      embolism, confirmed bilateral lower lobe infiltrate consistent with      aspiration.  This was addressed with antibiotic therapy.      Neurologic consultation was kindly provided by Dr. Terrace Arabia from      [facility], who recommended a brain MRI and also      increased the patient's Keppra to 750 mg  p.o. b.i.d.  Brain MRI was      unremarkable for any pathology.  The patient responded to above      management measures, and remained asymptomatic with no further      recurrence of seizures during the course of his hospitalization.   1. Aspiration pneumonia.  As mentioned above, the patient's chest CT      angiogram demonstrated bilateral pulmonary infiltrates consistent      with aspiration.  The patient was managed with intravenous Zosyn,      and by January 02, 2008, was afebrile, had normal white cell count      and had no respiratory tract symptoms.  He was therefore      transitioned to oral Avelox, to complete a further 7-day course of      treatment, i.e., a total of 10 days antibiotic therapy.   DISPOSITION:  The patient was on January 02, 2008, considered  sufficiently clinically recovered and stable, to be discharged.  He was  therefore, discharged accordingly.  He is recommended to return to  regular duties on January 09, 2008.   ACTIVITY:  As tolerated.   DIET:  No restrictions.   FOLLOWUP INSTRUCTIONS:  The patient is recommended to follow up with Dr.  Levert Feinstein, neurologist at [facility] on January 18, 2008, at 3:30 p.m.  Telephone number  [phone].  An appointment has  already been confirmed and information communicated to the patient.   SPECIAL INSTRUCTIONS:  The patient is recommended to avoid heights,  complicated or moving machinery, swimming without supervision, etc.      Isidor Holts, M.D.  Electronically Signed     CO/MEDQ  D:  01/02/2008  T:  01/02/2008  Job:  [id number]   cc:   Levert Feinstein, MD   Pramod P. Pearlean Brownie, MD  Fax: [phone]

## 2010-09-21 NOTE — H&P (Signed)
NAME:  Calvin [name], HACKLER NO.:  [id number]   MEDICAL RECORD NO.:  [id number]          PATIENT TYPE:  INP   LOCATION:  5017                         FACILITY:  [facility]   PHYSICIAN:  Alanson Puls, M.D.    DATE OF BIRTH:  November 05, 1980   DATE OF ADMISSION:  02/09/2007  DATE OF DISCHARGE:                              HISTORY & PHYSICAL   PRIMARY CARE PHYSICIAN:  1. None.  2. Neurology, [facility].   CHIEF COMPLAINT:  Left lower extremity cellulitis/calf pain.   HISTORY OF PRESENT ILLNESS:  1. Mr. Bua is a 30 year old with a history of seizure disorder who      has been stable on Tegretol and Depakote, followed by [facility], who has had several admits to the emergency department      for seizure activity and sub-therapeutic levels of Tegretol.  The      patient states he was last seen by [facility] in September      2008.  He presented to the emergency department with left lower      extremity calf pain, as well as a fever of one day's duration.  The      patient states that he was placed on Keflex on the day of      admission, via student health.  He presented to the emergency      department secondary to increased swelling and erythema of the left      lower extremity and was found to have left acute DVT in the common      formal vein, as well as cellulitis.  He denied any recent travel,      denied any other contacts with skin lesions and denied any previous      history of DVT in the past.  The patient states he has not had any      seizure activity and states that he took his a.m. dose of Tegretol      this morning.   PAST MEDICAL HISTORY:  Significant for seizure disorder.  No surgeries  in the past.   MEDICATIONS:  Include Tegretol 400 mg p.o. b.i.d. and Depakote 500 mg  q.h.s.   FAMILY HISTORY:  Mother is healthy.  Father deceased, in a car accident.  The patient has three sisters who are all healthy.  There is no  known  history of hypercoagulable state in the family.   ALLERGIES:  NO KNOWN DRUG ALLERGIES.   SOCIAL HISTORY:  The patient lives in [place], is a Consulting civil engineer at [employer].  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  He denies any  alcohol, marijuana or illegal drug use.  Admits to sexually active with  male partner, states uses condoms.   REVIEW OF SYSTEMS:  Negative, except for history of present illness.   PHYSICAL EXAM:  VITAL SIGNS:  Temperature 99.3, blood pressure 141/77,  heart rate 95, respirations 16, 100% on room air.  GENERAL:  Patient appears fatigued and states he has been vomiting.  CARDIAC:  Regular rate and rhythm with no murmurs.  PULMONARY:  Decreased bibasilar with some crackles heard.  ABDOMEN:  Soft, nontender with positive bowel sounds.  EXTREMITIES:  The patient is noted to have a tight erythematous lower  left extremity.  It appears more swollen than the right.  He has 2+  dorsalis pedis pulses, and there are no ulcerations.   White blood cell count 10.1, hemoglobin 15.3, platelets 177, 85% segs.  Sodium 136, potassium 4.2, chloride 100, bicarb 28, BUN 12, creatinine  1, glucose 102, blood cultures are pending.  Depakote and Tegretol level  are pending as well.  Lower extremity Dopplers positive for left acute  occlusive DVT and in the common femoral vein.  All others are patent.  There are no Baker's cyst.  The right shows no evidence of propagation.   ASSESSMENT/PLAN:  1. This is a 30 year old with acute left DVT, with no known      precipitance and no history of hypercoagulable state, no recent      surgeries either.  Question if the patient needs an eval for a      hypercoagulable state in the future.  We will treat his acute DVT      with Lovenox 1 mg/kg subcu q. 12 and start Coumadin.  We will start      Coumadin teaching as well, with a goal INR of 2 to 3.  The patient      will need treatment for at least 6 months.  2. For a seizure disorder,  we will draw a Tegretol and Depakote levels      and continue him on his current therapy.  We will caution the use      of these medicines along with his Coumadin, and he will need close      followup with neurology.  3. For cellulitis, blood cultures are pending.  We will cover for MRSA      and at least give him a dose of vancomycin and then attempt to      switch him over to a p.o. regimen, fluid electrolyte, and      nutrition.  Saline lock his fluids.  Electrolytes are stable.  He      can have a p.o. ad lib diet.   DISPOSITION:  The patient wishes for a short hospital stay, given  limited financial constraints of being a Consulting civil engineer.  He will need to  establish primary care physician in order that labs may be drawn,  especially his PT/INR given that he is on Depakote for seizure disorder.  We will also write for a social work consult to assist with medication  assistance.      Alanson Puls, M.D.  Electronically Signed     MR/MEDQ  D:  02/09/2007  T:  02/11/2007  Job:  [id number]

## 2010-09-21 NOTE — Consult Note (Signed)
NAME:  Calvin [name], HOLQUIN NO.:  [id number]   MEDICAL RECORD NO.:  [id number]          PATIENT TYPE:  INP   LOCATION:  0110                         [facility]   PHYSICIAN:  Levert Feinstein, MD          DATE OF BIRTH:  09-25-1980   DATE OF CONSULTATION:  12/31/2007  DATE OF DISCHARGE:                                 CONSULTATION   CHIEF COMPLAINT:  Seizure.   HISTORY OF PRESENT ILLNESS:  The patient is 30 year old right-handed  African American male, history of seizures since 30 years old.  He  usually can feel the seizure coming on, uncontrollable, head jerking to  the right side, followed by whole body tonic-clonic shaking, loss of  consciousness, post event confusion.  He initially had a seizure about a  couple times a month.  He has been treated with Depakote, under good  control from age 66 until 55.  When he started high school, he has  recurrent seizure with similar symptomatology, despite titrating dose of  Depakote.  AED was changed to Tegretol, and he was under care of  [facility], and apparently has seen nurse  practitioner, Latrelle Dodrill, regularly.   From reviewing the multiple emergency room visits in recent years, he  has not been compliant with his medications and Tegretol level was low  on multiple occasions, and presenting to emergency room multiple times  for seizure.  Later he was changed to Keppra 500 b.i.d. plus Depakote  1000 mg every night.  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  Yesterday, he  had four seizures over 24 hour period of time.  He also reported he no  longer has the warning signs, such as head jerking to the right side  prior to the general tonic-clonic movement.  He just presented with  sudden loss of consciousness and whole body jerking, last couple of  minutes, in between,  he was able to regain consciousness.  Last seizure  was 11 o'clock this morning.  He is now back to his baseline, and there  is some bruise at the left eye area.   REVIEW OF SYSTEMS:  As above. The CAT scan of the chest has demonstrated  bilateral lower lobe infiltration left worse than right.  The patient  denies lateralized motor sensory deficit.   PAST MEDICAL HISTORY:  1. Epilepsy.  2. DVT of left lower extremity.   SURGICAL HISTORY:  None.   MEDICATIONS ON ADMISSION:  1. Keppra 500 b.i.d.  2. Depakote 1000 mg ER every night.   ALLERGIES:  No known drug allergies.   SOCIAL HISTORY:  Denies smoke or drink.  Student at [employer].   FAMILY HISTORY:  Noncontributory.   PHYSICAL EXAMINATION:  Temperature 99.1, blood pressure 120/64, heart  rate of 91, respirations 17.  CARDIAC:  Regular rate, rhythm.  PULMONARY:  Clear to auscultation bilaterally.  NECK:  Supple.  No carotid bruits.  NEUROLOGICAL  EXAMINATION:  He is alert, oriented to history taking and  casual conversation.  Cranial nerves II-XII:  Pupil equal, round,  reactive to light.  Extraocular movements were full.  Visual fields are  full on confrontational test.  Facial sensation, strength was normal.  Uvula, tongue midline.  Head turning, shoulder shrugging normal and  symmetric.  Motor:  Normal tone back strength.  Sensory:  Intact to  light touch, pinprick.  Deep tendon reflex normal, symmetric. Plantar  responses flexor.  No dysmetric.  Gait deferred.  He has a Foley  catheter.   LABORATORY EVALUATION:  Upon this admission, his Tegretol level less  than 2.  Depakote level 53 and the CMP was normal.  The CBC was elevated  white count of 11 and neutrophils elevated 87%.  CT of the brain and cervical without contrast was normal.  CT of the  chest has shown bilateral lower lobe, left more than right,  infiltration.   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   PLAN:  As follows:  1. Titrating Keppra 750 b.i.d.  2. He has not had an imaging study for years.  Though he has a history      of noncompliance with the medications, at recent emergency room      presentation, Depakote level was within the therapeutic range 50-      60s, and in that regard, I will repeat MRI of the brain      with/without contrast to see if there is any mesial temporal      sclerosis.  3. Follow up with [facility] 1 month upon discharge.      Levert Feinstein, MD  Electronically Signed     YY/MEDQ  D:  12/31/2007  T:  12/31/2007  Job:  [id number]

## 2010-09-21 NOTE — Consult Note (Signed)
NAME:  Calvin [name], Calvin [name] NO.:  [id number]   MEDICAL RECORD NO.:  [id number]          PATIENT TYPE:  INP   LOCATION:  1239                         FACILITY:  [facility]   PHYSICIAN:  Marlan Palau, M.D.  DATE OF BIRTH:  [birth year]   DATE OF CONSULTATION:  06/17/2008  DATE OF DISCHARGE:                                 CONSULTATION   HISTORY OF PRESENT ILLNESS:  Calvin [name] is a 30 year old right-handed  black male born 04/28/1981, with a history of intractable  seizures.  This patient has been on a multitude of medications in the  past and there has been some question of medical compliance on these.  Patient has been on Dilantin, Depakote, carbamazepine.  Levels tend to  be low on these medications.  Patient was placed on Keppra and has done  well with seizure control taking 750 mg twice daily.  Patient, however,  could not afford the medication, ran out of the medication 2 days ago,  and came in with a generalized tonic-clonic seizure.  Patient has  undergone an MRI study in August of 2009 which was unremarkable.  Neurology has been asked to see this patient at this time for further  evaluation.   PAST MEDICAL HISTORY:  Significant for:  1. History of seizure disorder.  2. Medical noncompliance.  3. DVT, left leg.  4. Hypertension.  5. Rhabdomyolysis associated with seizure.   PATIENT HAS NO KNOWN ALLERGIES.  Does not smoke or drink.   CURRENT MEDICATIONS:  At this time, include:  1. Keppra 500 mg IV every 12 hours.  2. Clonidine 0.1 mg twice daily.  3. Protonix 40 mg at night.  4. Tylenol if needed.   SOCIAL:  Again, patient does not smoke or drink, HAS NO KNOWN ALLERGIES.   SOCIAL HISTORY:  Patient lives in the [place], [place], area,  single, does not work, lives with a friend from church.  Patient has no  children.   FAMILY MEDICAL HISTORY:  Noted that mother is alive and well.  Father  died following a motor vehicle accident.  Patient  has 3 sisters.  No  family history of seizures.   REVIEW OF SYSTEMS:  Notable for no recent fevers or chills.  Patient  denies headache, neck pain, shortness of breath, chest pains, nausea,  vomiting, abdominal pain, troubles controlling the bowels or bladder.  Patient denies any focal numbness or weakness of the arms or legs.  Denies any visual changes or dizziness.   PHYSICAL EXAMINATION:  VITALS:  Blood pressure 134/82.  Heart rate is  73.  Temperature afebrile.  GENERAL:  This patient is a well-developed black male who is alert and  cooperative at time of examination.  HEENT EXAMINATION:  Atraumatic.  Eyes, pupils are equal, round, and  react to light.  Disks are flat bilaterally.  NECK:  Supple.  No carotid bruits noted.  RESPIRATORY EXAMINATION:  Clear.  CARDIOVASCULAR EXAMINATION:  Reveals a regular rate and rhythm.  No  obvious murmurs or rubs noted.  EXTREMITIES:  Without significant edema.  NEUROLOGIC EXAMINATION:  Cranial nerves  as above.  Facial symmetry is  present.  Patient has good sensation of the face to pinprick and soft  touch bilaterally.  There is good strength of facial muscles and muscles  in the head during shoulder shrug bilaterally.  Speech is well  enunciated, not aphasic.  Motor testing reveals 5/5 strength in all 4's.  Good symmetric motor tone is noted throughout.  Sensory testing is  intact to pinprick, soft touch, vibratory sensation throughout.  Patient  has good finger to nose and toe to finger bilaterally.  Gait was not  tested.  Deep tendon reflexes symmetrical.  Normal toes downgoing  bilaterally.   LABORATORY VALUES:  Notable for a white count of 11.2, hemoglobin 15.3,  hematocrit of 46.3, MCV of 92.9, platelets of 230, sodium 139, potassium  4.1, chloride of 109, CO2 of 24, glucose 106, BUN of 9, creatinine of  1.11, CK of 967, TSH of 0.2.  Drug screen negative.  Urinalysis reveals  a specific gravity of 1.011, pH of 5.0, 0 to 2 red cells.    IMPRESSION:  1. History of chronic seizure disorder with recent recurrence.  2. Medical noncompliance.   This patient claims he is not able to afford Keppra at this point.  Records in the past indicate that he has been noncompliant on cheaper  medications including Tegretol, Dilantin, and Depakote.  Patient will be  switched back to Tegretol and we will need to follow levels closely.  Patient is not to operate a motor vehicle at this time.   PLAN:  1. Again, Tegretol 200 mg 1/2 tablet twice daily for 5 days and go to      1 full tablet twice daily.  2. Patient will follow up in the next 2 to 3 weeks through our office      to have blood work checked.  I need to adjust medications      accordingly.  I will follow patient's clinical course while in      house.      Marlan Palau, M.D.  Electronically Signed     CKW/MEDQ  D:  06/17/2008  T:  06/17/2008  Job:  [id number]   cc:   [facility]  [address]  [address]

## 2010-09-21 NOTE — H&P (Signed)
NAME:  Calvin [name], Calvin [name]               ACCOUNT NO.:  [id number]   MEDICAL RECORD NO.:  [id number]          PATIENT TYPE:  INP   LOCATION:  1410                         FACILITY:  [facility]   PHYSICIAN:  Della Goo, M.D. DATE OF BIRTH:  1980/06/06   DATE OF ADMISSION:  06/16/2008  DATE OF DISCHARGE:                              HISTORY & PHYSICAL   PRIMARY CARE PHYSICIAN:  Unassigned.   CHIEF COMPLAINT:  Seizures.   HISTORY OF PRESENT ILLNESS:  This 30 year old male who was brought  emergently to the emergency department after suffering a witnessed  seizure at the bus stop.  Per the EMS record, the description of the  activity was tonic clonic-like activity.  The patient also had loss of  consciousness and when he became arousable, was confused and  disoriented.  When the patient arrived in the emergency department, he  was awake and alert but drowsy.  The patient suffered another seizure  which was also witnessed in the emergency department.  He was medicated  at that time with Valium.  Before the second seizure, the patient had  been arousable and he reported that he had not been able to get his  medications secondary to not having money.  The patient also reported  that his last seizure was about 6 months ago.   REVIEW OF SYSTEMS:  Unable to obtain from the patient at this time  secondary to sedation from medications and his seizure activity   PAST MEDICAL HISTORY:  Significant for seizure disorder.   PAST SURGICAL HISTORY:  Unable to obtain.   MEDICATIONS:  None.   ALLERGIES:  No known drug allergies.   SOCIAL HISTORY:  The patient is unable to provide this as well per the  medical records.  She is a nonsmoker and occasionally drinks alcohol.  The patient denies any history of illicit drug usage.   FAMILY HISTORY:  Noncontributory.   PHYSICAL EXAMINATION:  GENERAL:  This is a 30 year old well-nourished,  well-developed male who is currently obtunded and in no acute  distress  currently.  VITAL SIGNS:  Temperature 98.9, blood pressure 120/52, heart rate 88,  respirations 16, oxygen saturation  ion 97-98%.  HEENT:  Normocephalic, atraumatic.  No scleral icterus or conjunctival  hemorrhage or erythema.  Pupils are sluggish but reactive to light.  Extraocular movements are roving at this time and nares are patent  bilaterally.  Oropharynx clear.  No visible tongue lacerations or lip  lacerations.  NECK:  Supple.  No jugular venous distention, adenopathy.  No  thyromegaly.  CARDIOVASCULAR:  Regular rate and rhythm.  No murmurs, gallops or rubs.  LUNGS:  Clear to auscultation bilaterally.  ABDOMEN:  Positive bowel sounds.  Soft, nontender, nondistended.  EXTREMITIES:  Without cyanosis, clubbing or edema.  NEUROLOGIC:  The patient is obtunded.  He is able to move all four  extremities.  There are no focal deficits on examination.   LABORATORY STUDIES:  Chemistry with a sodium of 145, potassium 3.5,  chloride 106, carbon dioxide 9, BUN 13, creatinine 1.41, glucose 140,  calcium 9.8.  CPK total 1199.  Urine  drug screen negative.   Dictation stopped here.      Della Goo, M.D.  Electronically Signed     HJ/MEDQ  D:  06/17/2008  T:  06/18/2008  Job:  [id number]

## 2010-09-24 NOTE — H&P (Signed)
NAME:  Calvin [name], Calvin [name] NO.:  [id number]   MEDICAL RECORD NO.:  [id number]          PATIENT TYPE:  INP   LOCATION:  3018                         FACILITY:  [facility]   PHYSICIAN:  Thomasenia Bottoms, MDDATE OF BIRTH:  03-21-1981   DATE OF ADMISSION:  03/22/2006  DATE OF DISCHARGE:                                HISTORY & PHYSICAL   CHIEF COMPLAINT:  Seizure.   HISTORY OF PRESENT ILLNESS:  Mr. Devita is a 30 year old who presented to  the emergency department today after having a seizure while on campus.  The  patient was playing basketball and one of the witnesses tells me that he  just stopped, dropped and started seizing.  The patient had been hit in the  head by a basketball earlier but by report from the patient's friends his  Tegretol level was very low and when he was in the emergency department  earlier, he was given a dose of Tegretol then released.  About 4:30 this  afternoon, back on campus with his friends, he had another seizure which was  witnessed.  He was walking and then his eyes rolled back and he dropped down  and started seizing.  The patient does not recall that seizure.  He says his  memory is pretty fuzzy for most of the day today, but he does tell me he ran  out of his medications so he missed this morning's dose and he missed last  night's dose.  He has a slight headache at the time of my evaluation but  otherwise says he feels in his usual state of health.   PAST MEDICAL HISTORY:  Significant for seizure disorder that started when he  was in the fourth grade.  He had seizures, he says, on average of about once  every two months.  He also had a severe case of left lower extremity  cellulitis earlier this year.  Those are his only medical problems. He  denies any surgery.   MEDICATIONS ON ARRIVAL:  Just Tegretol 400 mg p.o. twice a day.   SOCIAL HISTORY:  No cigarettes, no alcohol, no illicit drugs.   FAMILY HISTORY:  He denies any history  of seizures in his family.   REVIEW OF SYSTEMS:  Essentially negative.  CONSTITUTIONAL:  Appetite is  fine. No weight loss.  No night sweats.  HEENT:  No headaches.  No double  vision.  He had some light headache now from seizures, but generally he does  not have trouble with headaches, ringing in his ears,  sore throat.  CARDIOVASCULAR:  No chest pain.  He has some chronic swelling in the left  lower ankle area where he had the cellulitis, but otherwise no edema.  RESPIRATORY:  No cough, shortness of breath, or hemoptysis.  GI:  No  diarrhea, constipation.  No heartburn of acid reflux. He has not vomited  blood or seen any blood in his stool.  GU:  No blood in his urine.  MUSCULOSKELETAL:  No joint pains.  NEUROLOGIC:  No asymmetric weakness or  paresthesias.  PSYCHIATRIC:  Negative.   PHYSICAL  EXAMINATION:  VITAL SIGNS:  In the emergency department, the  patient's temperature is 97.6, blood pressure 107/38, pulse 112, respiratory  rate 20, oxygen saturation 98% on room air.  GENERAL:  On physical examination, he is a young gentleman, well-nourished,  well-developed in no acute distress.  HEENT:  Normocephalic, atraumatic.  Pupils are equal and round.  Sclerae  nonicteric.  Oral mucosa slightly dry.  NECK:  Supple.  No lymphadenopathy.  No thyromegaly.  No jugular venous  distension.  CARDIAC:  Regular rate and rhythm.  No murmurs, gallops or rubs.  LUNGS:  Clear to auscultation bilaterally.  No wheezes, rhonchi or rales.  ABDOMEN:  Soft, nontender, nondistended.  Normoactive bowel sounds.  No  masses are appreciated.  EXTREMITIES:  No evidence of clubbing or cyanosis.  He does have some mild  chronic edema in the left lower extremity just above the ankle.  There is  some chronic hyperpigmentation changes there as well.  NEUROLOGIC:  He is alert and oriented x3.  Cranial nerves II-XII intact  grossly.  He has no slurred speech.  He has 5/5 strength in his upper and  lower  extremities.  His sensory exam is intact upper and lower extremities.  He has normal muscle tone in both.  He has downgoing Babinski reflexes  bilaterally.  SKIN:  Intact with no open lesions or rashes.  He does have tinea pedis  bilaterally.  MUSCULOSKELETAL:  No fusion of his joints and good range of motion.   LABORATORY DATA:  White count of 6.l8, hemoglobin 14.4, hematocrit 47.7,  platelet count 186.  His sodium is 142, potassium 4.2, chloride 109, bicarb  24, glucose 94, BUN 17, creatinine 1.3.  Total bilirubin, AST, ALT and alk  phos all within normal limits.  Total protein and albumin also within normal  limits.   ASSESSMENT/PLAN:  A 30 year old with known history of seizure disorder  presents with two seizures today and a subtherapeutic Tegretol level.  The  plan is to order an extra Tegretol dose.  Will observe him overnight in the  hospital with seizure precautions and if he is doing well tomorrow, we may  discharge him tomorrow.  If he continues to have seizures, we may have to  load him with Dilantin in the short run.      Thomasenia Bottoms, MD  Electronically Signed     CVC/MEDQ  D:  03/21/2006  T:  03/22/2006  Job:  [id number]

## 2010-09-24 NOTE — Discharge Summary (Signed)
NAME:  Calvin [name], Calvin [name]               ACCOUNT NO.:  [id number]   MEDICAL RECORD NO.:  [id number]          PATIENT TYPE:  INP   LOCATION:  3004                         FACILITY:  [facility]   PHYSICIAN:  Jonna L. Robb Matar, M.D.DATE OF BIRTH:  [birth year]   DATE OF ADMISSION:  11/24/2005  DATE OF DISCHARGE:                                 DISCHARGE SUMMARY   ALLERGIES:  NONE.   FINAL DIAGNOSES:  1.  Left lower extremity cellulitis.  2.  Seizure disorder.  3.  Intractable nausea and vomiting.   HISTORY:  This 30 year old African American male woke up with severe left  lower extremity pain, swelling, nausea, vomiting and fever.  He had previous  episode about a year ago.  He has a history of seizure disorder, but tends  to not be compliant with the medication apparently due to side-effects.   PHYSICAL EXAMINATION:  Notable for 102.7, pulse 104.  Left lower extremity was edematous up to the knees, erythematous, tender to  palpation.   LABORATORY DATA:  White count 9.1, BUN 19, creatinine 1.1.   HOSPITAL COURSE:  The patient was started on vancomycin and Zosyn and blood  cultures are pending.  The leg has already started to do well.  The  patient's Tegretol level was less than 2 so he was restarted on this  medication.   DISPOSITION:  The patient will be discharged on doxycycline 100 mg b.i.d.  for 10 days.  Tegretol XR 200 mg twice a day and Phenergan 25 up to t.i.d.  p.r.n. nausea.  He should return [facility] in a week at which time his  leg can be reevaluated and he can get another Tegretol level.  I have asked  him to wash with antibacterial soap and not to go barefoot and to be very  careful to avoid any breaks in the skin.      Jonna L. Robb Matar, M.D.  Electronically Signed     JLB/MEDQ  D:  11/25/2005  T:  11/25/2005  Job:  [id number]

## 2010-09-24 NOTE — H&P (Signed)
NAME:  YUJI, WALTH NO.:  [id number]   MEDICAL RECORD NO.:  [id number]          PATIENT TYPE:  INP   LOCATION:  1829                         FACILITY:  [facility]   PHYSICIAN:  Elliot Cousin, M.D.    DATE OF BIRTH:  30-Sep-1980   DATE OF ADMISSION:  08/11/2005  DATE OF DISCHARGE:                                HISTORY & PHYSICAL   PRIMARY CARE PHYSICIAN:  The patient is unassigned.   CHIEF COMPLAINT:  Seizure.  Left red, swollen leg.   HISTORY OF PRESENT ILLNESS:  The patient is a 30 year old man with a past  medical history significant for seizures, who presents to the emergency  department after his friends witnessed a seizure.  The patient arrived by  EMS.  The EMS records are not available.  The patient does not recall having  the seizure.  He does not recall the events leading up to the seizure.  There was no reported head trauma.  The patient does say that he has a  slight headache all over.  He has not felt any lumps or bumps on his head.  He denies any focal joint or limb pain.  The patient readily admits that he  does not take his Tegretol as prescribed.  When questioned why, he really  gave no specific reason.  He denies specific side-effects such as  interference with his studies, sexual side-effects, over-sedation, etc.  He  has no problems purchasing Tegretol.  Per the emergency department records,  the patient has had at least 10 visits to the emergency department over the  past 15 months because of seizures.   The patient also complains of a left swollen painful leg; it started 3 days  ago.  He presented to the emergency department on August 05, 2005.  He was  given a prescription for Keflex.  He says that he has been taking Keflex for  the past 3 days; the leg has not improved.  He does not recall any  precipitating event leading up to the left leg pain and swelling.  The pain  is described as throbbing and is mild.  He has had no associated fever  or  chills.  He has had intermittent shortness of breath.  No chest pain or  pleurisy.   PAST MEDICAL HISTORY:  1.  Seizure disorder, diagnosed when he was in the fourth grade.  2.  Left lower extremity cellulitis, diagnosed during the emergency      department visit on August 05, 2005.  3.  Noncompliance.   MEDICATIONS:  1.  Tegretol 200 mg b.i.d.  2.  Keflex 500 mg b.i.d., prescribed August 05, 2005.   ALLERGIES:  No known drug allergies.   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  He denies tobacco,  alcohol and drug use.  He is single.  He has no children.  His home is in  [place], [place].  He lives with his mother.   FAMILY HISTORY:  His mother is 24 year old years  of age and has no major  health problems.  His father died at 35 years of age from a motor vehicle  accident.   REVIEW OF SYSTEMS:  Review of systems as above in the history of present  illness; otherwise, the review of systems is negative.   PHYSICAL EXAMINATION:  VITAL SIGNS:  Temperature 97.8, blood pressure  128/46, pulse 84, oxygen saturation 97% on 2 L of nasal cannula oxygen.  GENERAL:  The patient is a pleasant 30 year old [place] American man who is  currently asleep, but easily arousable, in no acute distress.  HEENT:  Head is normocephalic and nontraumatic.  Pupils are equal, round and  reactive to light.  Extraocular movements are intact.  Conjunctivae are  clear.  Sclerae are white.  Tympanic membranes are clear bilaterally.  Nasal  mucosa is mildly dry.  No sinus tenderness.  Oropharynx reveals mild dry  mucous membranes, no posterior exudates or erythema.  NECK:  Supple with no adenopathy, no thyromegaly, no bruit, no JVD.  LUNGS/RESPIRATORY:  The patient appears to be mildly dyspneic.  Lungs,  however, are clear to auscultation bilaterally and down to the bases.  HEART:  S1 and S2 with no murmurs,  rubs, or gallops.  ABDOMEN:  Positive bowel sounds, soft, nontender and non-distended.  No  hepatosplenomegaly.  No masses palpated.  EXTREMITIES:  Pedal pulses are palpable bilaterally, although somewhat  decreased on the left lower extremity.  No abnormalities of the right lower  extremity.  The left lower extremity has 3+ nonpitting edema with diffuse  erythema from the knee down to the dorsum of the foot, mildly tender  globally.  He does have a good range of motion of both of his legs  bilaterally.  No acute abnormalities of his upper extremities.  SKIN:  As stated above, he does have diffuse erythema from the knee down to  the dorsum of the left lower extremity.  He has a mild ecchymotic lesion on  the left side of his face.  No other apparent abnormalities.  NEUROLOGIC:  The patient is initially lethargic, but becomes alert and  oriented x3.  Cranial nerves II-XII are intact.  Strength is 5/5 throughout.  Sensation is intact.   RADIOLOGIC FINDINGS:  Chest x-ray reveals no acute disease.   ADMISSION LABORATORY DATA:  WBC 7.8, hemoglobin 15.9, hematocrit 46.9,  platelets 247,000.  Sodium 140, potassium 3.9, chloride 104, CO2 26, glucose  102, BUN 14, creatinine 1.4, calcium 9.8.  Carbamazepine level less than 2.  Urine drug screen negative.   ASSESSMENT:  1.  Seizure with post-ictal lethargy.  The patient has been noncompliant      with anticonvulsant therapy.  The exact reason why is unclear.  When      questioned, the patient denies any specific side-effects or financial      constraints.  The patient has had multiple emergency department visits      for seizures.  2.  Left lower extremity cellulitis.  The patient was started on Keflex      approximately 4 days ago.  He has failed outpatient therapy.  Per the      patient's history, there was no preceding trauma.  No history of      diabetes mellitus. 3.  Mild dyspnea.  The patient is mildly dyspneic on exam, although his       lungs are very clear and he appears to be oxygenating within normal      limits on 2 L  of nasal cannula oxygen.  However, in light of the swollen      left leg, ruling out a pulmonary embolus is warranted.   PLAN:  1.  The patient will be admitted for further evaluation and management.  2.  Restart Tegretol at 200 mg b.i.d.  We will also order Ativan IV on stand-      by and as needed.  3.  Seizure precautions.  4.  We will start intravenous vancomycin therapy per Pharmacy.  The      antibiotics, Zosyn, Ancef, and Levaquin were considered; however, they      all lower the seizure threshold.  We will therefore treat the patient      with      vancomycin, assuming this is another case of community-acquired MRSA      cellulitis.  5.  We will check a CT scan of the chest to rule out PE.  6.  The patient was advised and encouraged to start taking his      anticonvulsant medication on a regular basis.      Elliot Cousin, M.D.  Electronically Signed     DF/MEDQ  D:  08/11/2005  T:  08/11/2005  Job:  [id number]

## 2010-09-24 NOTE — Discharge Summary (Signed)
NAME:  Calvin [name], Calvin [name]               ACCOUNT NO.:  [id number]   MEDICAL RECORD NO.:  [id number]          PATIENT TYPE:  INP   LOCATION:  3018                         FACILITY:  [facility]   PHYSICIAN:  Mobolaji B. Bakare, M.D.DATE OF BIRTH:  1980-09-07   DATE OF ADMISSION:  03/22/2006  DATE OF DISCHARGE:  03/23/2006                                 DISCHARGE SUMMARY   PRIMARY CARE PHYSICIAN:  Unassigned.   FINAL DIAGNOSIS:  Seizure.   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  He ran out of his prescription Tegretol and missed 1 day's dose.  He  subsequently had a seizure activity on the day of admission.  On evaluation  in the emergency room, the Tegretol level was low 2.1.  Patient was  subsequently loaded with Tegretol.   Since admission, he has not had any seizure and Tegretol level has been  monitored.  This level has gradually climbed up.  At the time of discharge,  Tegretol level is 4.6.  I will give an extra dose of Tegretol today.  Hopefully, this will be maintained in the current protective range.   DISCHARGE MEDICATIONS:  1. Tegretol 400 mg p.o. 1 tablet 3 times today, then 1 p.o. b.i.d. which      is as before.   FOLLOWUP:  Check Tegretol level in 2 weeks.   DISCHARGE CONDITION:  Stable.   VITALS ON DISCHARGE:  Temperature 97.5, pulse 63, respiratory rate of 18,  blood pressure 101/58, O2 SAT's of 95% on room air.   DISCHARGE LABORATORY DATA:  Tegretol level 4.6.  C-met was normal.      Mobolaji B. Corky Downs, M.D.  Electronically Signed     MBB/MEDQ  D:  03/23/2006  T:  03/24/2006  Job:  [id number]

## 2010-09-24 NOTE — Discharge Summary (Signed)
NAME:  Calvin [name], Calvin [name]               ACCOUNT NO.:  [id number]   MEDICAL RECORD NO.:  [id number]          PATIENT TYPE:  INP   LOCATION:  3028                         FACILITY:  [facility]   PHYSICIAN:  Mobolaji B. Bakare, M.D.DATE OF BIRTH:  [birth year]   DATE OF ADMISSION:  08/10/2005  DATE OF DISCHARGE:  08/12/2005                                 DISCHARGE SUMMARY   FINAL DIAGNOSES:  1.  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  2.  Noncompliance with medication.  3.  Cellulitis, left lower extremity.   PROCEDURES:  1.  Chest x-ray:  No acute cardiopulmonary disease.  2.  CT scan of the chest:  No evidence of pulmonary embolus.  3.  Lower extremity Dopplers:  No evidence of deep vein thrombosis.   BRIEF HISTORY:  Mr. 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  Apparently Mr.  [name] has a history of seizure disorder and he is on Tegretol b.i.d.  However, he has not been compliant with his medications and has been having  recurrent seizures with multiple ED visits.  The patient stated that  availability of funds is not part of the problem.  It appears he has not  understood the importance of compliance to medications.  In addition to  having recurrent seizures, the patient also had a three to four-day history  of left lower extremity swelling and pain.  He was initially treated on an  outpatient basis for lower extremity cellulitis with Keflex.  The pain and  swelling is subsiding but not completely abated.  He was admitted for  stabilization and evaluation to rule out DVT and PE given the history of  intermittent shortness of breath.  There was no chest pain or pleurisy.   HOSPITAL COURSE:  Problem 1.  SEIZURES:  The patient was started on Tegretol 200 mg b.i.d.  His Tegretol level prior to discharge was 4.0, on the lower limit of normal,  and it is felt that this will stabilize if he continues to use his  medications.  Tegretol level on admission was less than 2.  The patient was  counseled re compliance with medications.  He has an arrangement with his  sister to obtain medical insurance.  He was also given information regarding  [facility] and follow-up.   Problem 2.  LOWER EXTREMITY CELLULITIS:  He was continued on antibiotics  with vancomycin for possible MRSA cellulitis.  He did not give any history  of trauma.  He remained afebrile and white cell count was normal.  He  received vancomycin during the course of hospitalization.  He was discharged  home on doxycycline 100 mg b.i.d. to complete 10 days of treatment.   DISCHARGE CONDITION:  Stable.   DISCHARGE MEDICATIONS:  1.  Tegretol 200 mg b.i.d.  2.  Doxycycline 100 mg b.i.d. for 10 days.  3.  Chloraseptic mouthwash.   Follow-up occurs every one to two weeks.      Mobolaji B. Corky Downs, M.D.  Electronically Signed     MBB/MEDQ  D:  08/22/2005  T:  08/22/2005  Job:  [id number]

## 2010-09-24 NOTE — H&P (Signed)
NAME:  ANANIAS, KOLANDER NO.:  [id number]   MEDICAL RECORD NO.:  [id number]          PATIENT TYPE:  INP   LOCATION:  1825                         FACILITY:  [facility]   PHYSICIAN:  Lonia Blood, M.D.       DATE OF BIRTH:  1980-12-29   DATE OF ADMISSION:  11/24/2005  DATE OF DISCHARGE:                                HISTORY & PHYSICAL   PRIMARY CARE PHYSICIAN:  Unassigned.   CHIEF COMPLAINT:  Left leg pain.   HISTORY OF PRESENT ILLNESS:  Mr. Dilworth is a 30 year old African-American  man who reported that he woke up tonight with severe lower extremity pain.  When he looked he saw that his left lower extremity was swollen and painful  and that he also started having nausea, vomiting and fever.  Apparently the  patient had a single episode about a year ago and he was admitted to the  hospital for workup and observation.   PAST MEDICAL HISTORY:  1.  Significant for seizure disorder with recurrent seizures and medication      noncompliance.  2.  Recurrent episodes of cellulitis of the left lower extremity.   HOME MEDICATIONS:  Tegretol XR 200 mg twice a day.   SOCIAL HISTORY:  The patient is a Consulting civil engineer at [employer].  He does not drink  alcohol, does not smoke cigarettes, and does not use any illicit drugs.   FAMILY HISTORY:  The patient's mother is alive at age 25 and healthy.  The  patient's father died at age 77. The patient has three sisters that are  alive and healthy.   REVIEW OF SYSTEMS:  As per HPI.  Also the patient is complaining of a mild  headache, all other systems are negative.   PHYSICAL EXAMINATION:  VITAL SIGNS:  On admission temperature 102.7, blood  pressure 120/67, pulse 104, respirations 24, saturation 100% on room air.  GENERAL:  Well-developed, well-nourished African-American man in no acute  distress lying on the stretcher, alert and oriented to place, person and  time.  HEENT:  Head is normocephalic, atraumatic.  Eyes, pupils equal, round,  reactive to light and accommodation.  Extraocular movements intact.  Conjunctivae are pink.  Sclerae anicteric.  Throat clear.  NECK: Supple, no JVD, no carotid bruits.  CHEST:  Clear to auscultation bilaterally without wheezes, rhonchi or  crackles.  HEART:  Regular rate and rhythm without murmurs, rubs or gallops.  ABDOMEN:  Soft, nontender, nondistended, some bowel sounds are present.  LOWER EXTREMITY:  The left leg has edema +2 up to the knees with erythema  and tenderness to palpation.  There is no palpable mass.  There is no  fluctuation underneath the skin. Pulses are present.   LABORATORY VALUES:  On admission white blood cell count is 9.1, hemoglobin  15.1, platelet count is 144.  Sodium 136, potassium 4.6, chloride 102, BUN  19, creatinine 1.1.   ASSESSMENT/PLAN:  1.  Cellulitis of the left lower extremity in a 30 year old man with      concurrent nausea and vomiting. The plan is to admit the patient to  hospice, start him on empiric antibiotics, Vancomycin and Zosyn after      obtaining two blood cultures.  The patient will be monitored clinically      and depending on his course day, a computer tomography scan of the leg      will be obtained and then surgical consultation will be obtained.  2.  Seizure disorder.  The patient's Tegretol level is less than 2 on      admission which indicates medication noncompliance. The patient will be      started back on Tegretol 400 mg twice a day and he will be monitored      closely for seizure recurrence.      Lonia Blood, M.D.  Electronically Signed     SL/MEDQ  D:  11/24/2005  T:  11/24/2005  Job:  [id number]

## 2011-01-27 LAB — I-STAT 8, (EC8 V) (CONVERTED LAB)
BUN: 23
Bicarbonate: 23.4
HCT: 46
Hemoglobin: 15.6
Operator id: 146091
Sodium: 139

## 2011-01-27 LAB — CBC
HCT: 44
MCV: 86.6
Platelets: 180
RBC: 5.09
WBC: 7.5

## 2011-01-27 LAB — DIFFERENTIAL
Eosinophils Relative: 0
Lymphocytes Relative: 11 — ABNORMAL LOW
Lymphs Abs: 0.8
Monocytes Relative: 8
Neutrophils Relative %: 82 — ABNORMAL HIGH

## 2011-01-27 LAB — CARBAMAZEPINE LEVEL, TOTAL: Carbamazepine Lvl: 6.5

## 2011-01-28 LAB — CBC
MCHC: 34.1
Platelets: 215
RBC: 5.01
RDW: 12.5

## 2011-01-28 LAB — COMPREHENSIVE METABOLIC PANEL
ALT: 20
AST: 27
Albumin: 4
CO2: 26
Calcium: 8.9
GFR calc Af Amer: >60
Sodium: 140
Total Protein: 6.7

## 2011-01-28 LAB — DIFFERENTIAL
Eosinophils Absolute: 0
Eosinophils Relative: 0
Lymphs Abs: 1.2
Monocytes Absolute: 0.8
Monocytes Relative: 8

## 2011-01-28 LAB — URINALYSIS, ROUTINE W REFLEX MICROSCOPIC
Glucose, UA: NEGATIVE
Ketones, ur: NEGATIVE
Protein, ur: 30 — AB
pH: 6

## 2011-01-28 LAB — URINE MICROSCOPIC-ADD ON

## 2011-01-31 LAB — URINALYSIS, ROUTINE W REFLEX MICROSCOPIC
Bilirubin Urine: NEGATIVE
Glucose, UA: NEGATIVE
Ketones, ur: NEGATIVE
Leukocytes, UA: NEGATIVE
Protein, ur: 100 — AB
pH: 5

## 2011-01-31 LAB — BASIC METABOLIC PANEL
BUN: 14
CO2: 25
Calcium: 9.2
Chloride: 103
Chloride: 108
Chloride: 102
Creatinine, Ser: 1.31
Creatinine, Ser: 1.48
GFR calc Af Amer: >60
GFR calc Af Amer: >60
GFR calc non Af Amer: 57 — ABNORMAL LOW
GFR calc non Af Amer: >60
GFR calc non Af Amer: >60
Glucose, Bld: 108 — ABNORMAL HIGH
Glucose, Bld: 91
Potassium: 3.6
Potassium: 3.7
Potassium: 4.5
Potassium: 4.5
Sodium: 142

## 2011-01-31 LAB — HEPATIC FUNCTION PANEL
ALT: 19
AST: 26
Albumin: 4.3
Bilirubin, Direct: 0.1
Total Protein: 7.2

## 2011-01-31 LAB — POCT I-STAT, CHEM 8
BUN: 13
BUN: 14
Chloride: 105
Chloride: 106
Creatinine, Ser: 1
Creatinine, Ser: 1.3
Sodium: 138
Sodium: 140
TCO2: 23
TCO2: 26

## 2011-01-31 LAB — CBC
HCT: 44.6
Hemoglobin: 13.9
MCHC: 34
MCV: 86.8
Platelets: 152
RBC: 4.71
RDW: 12.9
WBC: 13.3 — ABNORMAL HIGH
WBC: 5.5

## 2011-01-31 LAB — CARBAMAZEPINE LEVEL, TOTAL
Carbamazepine Lvl: <2 — ABNORMAL LOW
Carbamazepine Lvl: <2 — ABNORMAL LOW

## 2011-01-31 LAB — T3, FREE: T3, Free: 2.5 (ref 2.3–4.2)

## 2011-01-31 LAB — RAPID URINE DRUG SCREEN, HOSP PERFORMED
Amphetamines: NOT DETECTED
Barbiturates: NOT DETECTED
Benzodiazepines: NOT DETECTED
Cocaine: NOT DETECTED
Opiates: NOT DETECTED
Tetrahydrocannabinol: NOT DETECTED

## 2011-01-31 LAB — VALPROIC ACID LEVEL
Valproic Acid Lvl: <10 — ABNORMAL LOW
Valproic Acid Lvl: 48.8 — ABNORMAL LOW

## 2011-02-01 LAB — URINALYSIS, ROUTINE W REFLEX MICROSCOPIC
Bilirubin Urine: NEGATIVE
Hgb urine dipstick: NEGATIVE
Nitrite: NEGATIVE
Specific Gravity, Urine: 1.03
Urobilinogen, UA: 1
pH: 7

## 2011-02-01 LAB — CBC
HCT: 44.5
Hemoglobin: 15
MCHC: 33.7
MCV: 88
RBC: 5.05
WBC: 8.8

## 2011-02-01 LAB — POCT I-STAT, CHEM 8
BUN: 22
Calcium, Ion: 1.18
Creatinine, Ser: 1.6 — ABNORMAL HIGH
Glucose, Bld: 81
HCT: 43
Potassium: 4.3
Sodium: 142

## 2011-02-01 LAB — BASIC METABOLIC PANEL
CO2: 27
Chloride: 101
Creatinine, Ser: 1.13
GFR calc Af Amer: >60
Potassium: 3.8
Sodium: 137

## 2011-02-01 LAB — DIFFERENTIAL
Basophils Relative: 0
Eosinophils Absolute: 0
Lymphs Abs: 1.5
Monocytes Absolute: 0.6
Monocytes Relative: 6

## 2011-02-01 LAB — VALPROIC ACID LEVEL: Valproic Acid Lvl: 82.6

## 2011-02-07 LAB — POCT I-STAT, CHEM 8
BUN: 18
BUN: 18
Chloride: 107
Creatinine, Ser: 1.1
Creatinine, Ser: 1
Glucose, Bld: 86
Potassium: 4
Potassium: 4.1
Sodium: 141
TCO2: 25

## 2011-02-07 LAB — RAPID URINE DRUG SCREEN, HOSP PERFORMED
Amphetamines: NOT DETECTED
Barbiturates: NOT DETECTED
Benzodiazepines: NOT DETECTED
Cocaine: NOT DETECTED
Opiates: NOT DETECTED

## 2011-02-07 LAB — CBC
HCT: 42.7
Hemoglobin: 14.4
MCHC: 33.7
RDW: 13.3

## 2011-02-07 LAB — DIFFERENTIAL
Basophils Relative: 0
Monocytes Relative: 6
Neutro Abs: 3
Neutrophils Relative %: 62

## 2011-02-07 LAB — COMPREHENSIVE METABOLIC PANEL
Alkaline Phosphatase: 54
BUN: 19
CO2: 24
Calcium: 8.9
GFR calc non Af Amer: >60
Glucose, Bld: 94
Total Protein: 6.1

## 2011-02-07 LAB — VALPROIC ACID LEVEL: Valproic Acid Lvl: 85.2

## 2011-02-08 LAB — DIFFERENTIAL
Basophils Relative: 0
Eosinophils Absolute: 0
Eosinophils Absolute: 0
Eosinophils Relative: 0
Lymphocytes Relative: 26
Lymphocytes Relative: 29
Lymphs Abs: 1.2
Monocytes Relative: 11
Monocytes Relative: 8
Neutro Abs: 2.5
Neutrophils Relative %: 60
Neutrophils Relative %: 66

## 2011-02-08 LAB — POCT I-STAT, CHEM 8
BUN: 21
Chloride: 109
Creatinine, Ser: 1.3
Creatinine, Ser: 1.4
Glucose, Bld: 83
HCT: 45
Hemoglobin: 15.3
Hemoglobin: 15.3
Potassium: 4.3
Sodium: 141
Sodium: 143
TCO2: 27

## 2011-02-08 LAB — CBC
Hemoglobin: 14.7
MCV: 90.6
Platelets: 151
RBC: 4.77
RBC: 4.8
WBC: 4.1
WBC: 6.1

## 2011-02-08 LAB — GLUCOSE, CAPILLARY

## 2011-02-08 LAB — VALPROIC ACID LEVEL: Valproic Acid Lvl: 94.4

## 2011-02-08 LAB — ETHANOL: Alcohol, Ethyl (B): <5

## 2011-02-08 LAB — RAPID URINE DRUG SCREEN, HOSP PERFORMED: Tetrahydrocannabinol: NOT DETECTED

## 2011-02-11 LAB — CBC
HCT: 45.4 % (ref 39.0–52.0)
Hemoglobin: 15.2 g/dL (ref 13.0–17.0)
Hemoglobin: 15.6 g/dL (ref 13.0–17.0)
MCHC: 32.4 g/dL (ref 30.0–36.0)
MCHC: 33.6 g/dL (ref 30.0–36.0)
MCV: 91.9 fL (ref 78.0–100.0)
RBC: 4.97 MIL/uL (ref 4.22–5.81)
RBC: 5.24 MIL/uL (ref 4.22–5.81)

## 2011-02-11 LAB — URINALYSIS, ROUTINE W REFLEX MICROSCOPIC
Glucose, UA: NEGATIVE mg/dL
Leukocytes, UA: NEGATIVE
Leukocytes, UA: NEGATIVE
Nitrite: NEGATIVE
Nitrite: NEGATIVE
Protein, ur: 30 mg/dL — AB
Specific Gravity, Urine: 1.026 (ref 1.005–1.030)
Urobilinogen, UA: 0.2 mg/dL (ref 0.0–1.0)
Urobilinogen, UA: 0.2 mg/dL (ref 0.0–1.0)

## 2011-02-11 LAB — POCT I-STAT, CHEM 8
Creatinine, Ser: 1.5 mg/dL (ref 0.4–1.5)
HCT: 47 % (ref 39.0–52.0)
Hemoglobin: 16 g/dL (ref 13.0–17.0)
Potassium: 4.4 mEq/L (ref 3.5–5.1)
Sodium: 141 mEq/L (ref 135–145)

## 2011-02-11 LAB — DIFFERENTIAL
Basophils Absolute: 0.1 10*3/uL (ref 0.0–0.1)
Basophils Relative: 1 % (ref 0–1)
Eosinophils Absolute: 0 10*3/uL (ref 0.0–0.7)
Lymphs Abs: 5.2 10*3/uL — ABNORMAL HIGH (ref 0.7–4.0)
Monocytes Absolute: 0.4 10*3/uL (ref 0.1–1.0)
Monocytes Relative: 8 % (ref 3–12)
Neutro Abs: 3.2 10*3/uL (ref 1.7–7.7)
Neutrophils Relative %: 33 % — ABNORMAL LOW (ref 43–77)

## 2011-02-11 LAB — URINE CULTURE

## 2011-02-11 LAB — RAPID URINE DRUG SCREEN, HOSP PERFORMED
Opiates: NOT DETECTED
Tetrahydrocannabinol: NOT DETECTED

## 2011-02-11 LAB — COMPREHENSIVE METABOLIC PANEL
ALT: 64 U/L — ABNORMAL HIGH (ref 0–53)
CO2: 14 mEq/L — ABNORMAL LOW (ref 19–32)
Calcium: 9.5 mg/dL (ref 8.4–10.5)
Creatinine, Ser: 1.37 mg/dL (ref 0.4–1.5)
GFR calc non Af Amer: >60 mL/min (ref >60)
Glucose, Bld: 116 mg/dL — ABNORMAL HIGH (ref 70–99)

## 2011-02-11 LAB — URINE MICROSCOPIC-ADD ON: Urine-Other: NONE SEEN

## 2011-02-11 LAB — PROTIME-INR
INR: 1.2 (ref 0.00–1.49)
Prothrombin Time: 15.1 seconds (ref 11.6–15.2)

## 2011-02-15 LAB — BASIC METABOLIC PANEL
CO2: 27
Calcium: 9.1
Creatinine, Ser: 1.19
Glucose, Bld: 83

## 2011-02-17 LAB — BASIC METABOLIC PANEL
BUN: 12
Chloride: 100
Glucose, Bld: 102 — ABNORMAL HIGH
Potassium: 4.2

## 2011-02-17 LAB — CBC
HCT: 40.1
HCT: 45.7
Hemoglobin: 13.4
Hemoglobin: 13.4
Hemoglobin: 15.3
MCHC: 33.5
MCHC: 33.9
MCV: 87.7
MCV: 88.3
Platelets: 177
RBC: 4.54
RBC: 4.54
RDW: 12.6
RDW: 12.8
WBC: 10
WBC: 10.1

## 2011-02-17 LAB — COMPREHENSIVE METABOLIC PANEL
BUN: 15
CO2: 28
Calcium: 8.5
Chloride: 102
Creatinine, Ser: 1.14
GFR calc Af Amer: >60
GFR calc non Af Amer: >60
Glucose, Bld: 105 — ABNORMAL HIGH
Total Bilirubin: 0.7

## 2011-02-17 LAB — DIFFERENTIAL
Eosinophils Absolute: 0
Eosinophils Relative: 0
Lymphs Abs: 1
Monocytes Absolute: 0.6

## 2011-02-17 LAB — CULTURE, BLOOD (ROUTINE X 2): Culture: NO GROWTH

## 2011-02-17 LAB — PROTIME-INR
INR: 1.3
INR: 2.6 — ABNORMAL HIGH
INR: 2.9 — ABNORMAL HIGH
Prothrombin Time: 13.9
Prothrombin Time: 16.9 — ABNORMAL HIGH
Prothrombin Time: 28.9 — ABNORMAL HIGH
Prothrombin Time: 31.3 — ABNORMAL HIGH

## 2011-02-17 LAB — PROTEIN C, TOTAL: Protein C, Total: 36 % — ABNORMAL LOW (ref 70–140)

## 2011-02-17 LAB — PROTEIN S, TOTAL: Protein S Ag, Total: 88 % (ref 70–140)

## 2011-02-17 LAB — VALPROIC ACID LEVEL: Valproic Acid Lvl: 15.4 — ABNORMAL LOW

## 2011-06-18 ENCOUNTER — Emergency Department (HOSPITAL_COMMUNITY): Payer: Self-pay

## 2011-06-18 ENCOUNTER — Other Ambulatory Visit: Payer: Self-pay

## 2011-06-18 ENCOUNTER — Emergency Department (HOSPITAL_COMMUNITY)
Admission: EM | Admit: 2011-06-18 | Discharge: 2011-06-18 | Disposition: A | Discharge status: Home / Self Care | Payer: Self-pay | Attending: Emergency Medicine | Admitting: Emergency Medicine

## 2011-06-18 ENCOUNTER — Encounter (HOSPITAL_COMMUNITY): Payer: Self-pay | Admitting: *Deleted

## 2011-06-18 DIAGNOSIS — R609 Edema, unspecified: Secondary | ICD-10-CM | POA: Insufficient documentation

## 2011-06-18 DIAGNOSIS — H5710 Ocular pain, unspecified eye: Secondary | ICD-10-CM | POA: Insufficient documentation

## 2011-06-18 DIAGNOSIS — S0510XA Contusion of eyeball and orbital tissues, unspecified eye, initial encounter: Secondary | ICD-10-CM | POA: Insufficient documentation

## 2011-06-18 DIAGNOSIS — S0180XA Unspecified open wound of other part of head, initial encounter: Secondary | ICD-10-CM | POA: Insufficient documentation

## 2011-06-18 DIAGNOSIS — G40909 Epilepsy, unspecified, not intractable, without status epilepticus: Secondary | ICD-10-CM | POA: Insufficient documentation

## 2011-06-18 DIAGNOSIS — W1809XA Striking against other object with subsequent fall, initial encounter: Secondary | ICD-10-CM | POA: Insufficient documentation

## 2011-06-18 DIAGNOSIS — Z79899 Other long term (current) drug therapy: Secondary | ICD-10-CM | POA: Insufficient documentation

## 2011-06-18 DIAGNOSIS — S01502A Unspecified open wound of oral cavity, initial encounter: Secondary | ICD-10-CM | POA: Insufficient documentation

## 2011-06-18 HISTORY — DX: Unspecified convulsions: R56.9

## 2011-06-18 LAB — GLUCOSE, CAPILLARY: Glucose-Capillary: 162 mg/dL — ABNORMAL HIGH (ref 70–99)

## 2011-06-18 LAB — CBC
HCT: 43.3 % (ref 39.0–52.0)
Hemoglobin: 15.1 g/dL (ref 13.0–17.0)
MCHC: 34.9 g/dL (ref 30.0–36.0)
RDW: 12.2 % (ref 11.5–15.5)
WBC: 13.3 10*3/uL — ABNORMAL HIGH (ref 4.0–10.5)

## 2011-06-18 LAB — BASIC METABOLIC PANEL
BUN: 22 mg/dL (ref 6–23)
Chloride: 104 mEq/L (ref 96–112)
GFR calc Af Amer: 71 mL/min — ABNORMAL LOW (ref >90)
GFR calc non Af Amer: 61 mL/min — ABNORMAL LOW (ref >90)
Glucose, Bld: 106 mg/dL — ABNORMAL HIGH (ref 70–99)
Potassium: 4.1 mEq/L (ref 3.5–5.1)
Sodium: 140 mEq/L (ref 135–145)

## 2011-06-18 LAB — CARBAMAZEPINE LEVEL, TOTAL: Carbamazepine Lvl: 6.5 ug/mL (ref 4.0–12.0)

## 2011-06-18 MED ORDER — HYDROCODONE-ACETAMINOPHEN 5-325 MG PO TABS: 1.0000 | ORAL_TABLET | ORAL | Status: AC | PRN

## 2011-06-18 MED ORDER — SODIUM CHLORIDE 0.9 % IV BOLUS (SEPSIS)
1000.0000 mL | Freq: Once | INTRAVENOUS | Status: AC
  Administered 2011-06-18: 1000 mL via INTRAVENOUS

## 2011-06-18 MED ORDER — TETANUS-DIPHTH-ACELL PERTUSSIS 5-2.5-18.5 LF-MCG/0.5 IM SUSP
0.5000 mL | Freq: Once | INTRAMUSCULAR | Status: AC
  Administered 2011-06-18: 0.5 mL via INTRAMUSCULAR
  Filled 2011-06-18: qty 0.5

## 2011-06-18 NOTE — ED Provider Notes (Signed)
History     CSN: [id number]  Arrival date & time 06/18/11  1310   First MD Initiated Contact with Patient 06/18/11 1322      Chief Complaint  Patient presents with  . Seizures    (Consider location/radiation/quality/duration/timing/severity/associated sxs/prior treatment) HPI History provided by pt and friend.   Pt had a witnessed 10-15 minute, generalized tonic-clonic seizure while playing basketball this afternoon.  His friend reports that immediately after seizure started, he fell to the ground and struck his head.  Was not incontinent of urine but bit his tongue.  Pt reports that he has a h/o seizure disorder for which he takes Tegretol.  Has been compliant w/ his medication.  No recent head trauma.  No recent illnesses.   His wife reports that he is currently mentating at baseline.   Past Medical History  Diagnosis Date  . Seizures     Past Surgical History  Procedure Date  . Tonsillectomy     History reviewed. No pertinent family history.  History  Substance Use Topics  . Smoking status: Never Smoker   . Smokeless tobacco: Not on file  . Alcohol Use: No      Review of Systems  All other systems reviewed and are negative.    Allergies  Review of patient's allergies indicates no known allergies.  Home Medications   Current Outpatient Rx  Name Route Sig Dispense Refill  . CARBAMAZEPINE ER 400 MG PO TB12 Oral Take 400 mg by mouth 2 (two) times daily.      Marland Kitchen DIVALPROEX SODIUM 500 MG PO TBEC Oral Take 500 mg by mouth 2 (two) times daily.        BP 119/59  Pulse 122  Temp(Src) 97.6 F (36.4 C) (Oral)  Resp 16  SpO2 99%  Physical Exam  Nursing note and vitals reviewed. Constitutional: He is oriented to person, place, and time. He appears well-developed and well-nourished. No distress.  HENT:  Head: Normocephalic and atraumatic.       Left periorbital ecchymosis and edema.  2.5cm curved, subcutaneous laceration just lateral to left eyebrow.  A second,  superficial laceration runs parallel, just inferior to other lac.  Both clean and hemostatic.  Tiny, superficial lac to right edge of tongue.   Eyes:       Pt reports pain w/ ROM of left EOMs  Neck: Normal range of motion.  Cardiovascular: Normal rate and regular rhythm.   Pulmonary/Chest: Effort normal and breath sounds normal. He exhibits no tenderness.  Abdominal: Soft. Bowel sounds are normal. He exhibits no distension. There is no tenderness.  Musculoskeletal: Normal range of motion.  Neurological: He is alert and oriented to person, place, and time. He has normal reflexes. No cranial nerve deficit or sensory deficit. Coordination normal.       4/5 and equal upper and lower extremity strength.    Skin: Skin is warm and dry. No rash noted.  Psychiatric: He has a normal mood and affect. His behavior is normal.    ED Course  Procedures (including critical care time)   Date: 06/18/2011  Rate: 114  Rhythm: sinus tachycardia  QRS Axis: normal  Intervals: normal  ST/T Wave abnormalities: nonspecific T wave changes  Conduction Disutrbances:none  Narrative Interpretation:   Old EKG Reviewed: unchanged   LACERATION REPAIR Performed by: Otilio Miu Authorized by: Otilio Miu Consent: Verbal consent obtained. Risks and benefits: risks, benefits and alternatives were discussed Consent given by: patient Patient identity confirmed: provided demographic data Prepped  and Draped in normal sterile fashion Wound explored  Laceration Location: lateral to left eyebrow  Laceration Length: 4.5cm  No Foreign Bodies seen or palpated  Anesthesia: local infiltration  Local anesthetic: lidocaine 2% w/ epinephrine  Anesthetic total: 5 ml  Irrigation method: syringe Amount of cleaning: standard  Skin closure: prolene 4.0  Number of sutures: 10  Technique: simple interrupted  Patient tolerance: Patient tolerated the procedure well with no immediate  complications.   Labs Reviewed  GLUCOSE, CAPILLARY - Abnormal; Notable for the following:    Glucose-Capillary 162 (*)    All other components within normal limits  CBC - Abnormal; Notable for the following:    WBC 13.3 (*)    All other components within normal limits  BASIC METABOLIC PANEL - Abnormal; Notable for the following:    Glucose, Bld 106 (*)    Creatinine, Ser 1.50 (*)    GFR calc non Af Amer 61 (*)    GFR calc Af Amer 71 (*)    All other components within normal limits  CARBAMAZEPINE LEVEL, TOTAL   Ct Maxillofacial Wo Cm  06/18/2011  *RADIOLOGY REPORT*  Clinical Data: Trauma, pain  CT MAXILLOFACIAL WITHOUT CONTRAST  Technique:  Multidetector CT imaging of the maxillofacial structures was performed. Multiplanar CT image reconstructions were also generated.  Comparison: None.  Findings: Axial images shows no facial fracture.  No nasal bone fracture is identified.  Coronal reconstructed images shows mild right nasal septum deviation.  Bilateral semilunar canal is patent. There is no orbital rim or orbital floor fracture.  No zygomatic fracture is identified.  No mandibular fracture.  Sagittal images shows unremarkable visualized upper cervical spine. The oral pharyngeal and nasopharyngeal airway is patent.  The visualized upper cervical airway is patent.  No air-fluid levels are noted in maxillary sinuses.  No facial fluid collection.  No orbital hematoma.  Bilateral eye globe is symmetrical in appearance.  IMPRESSION:  1.  No facial fractures or fluid collection is noted.  Mild right nasal septum deviation. 2.  No orbital rim or orbital floor fracture.  No orbital hematoma. 3.  Patent nasopharyngeal and oral pharyngeal airway.  Original Report Authenticated By: Natasha Mead, M.D.     1. Seizure disorder       MDM  Pt w/ h/o seizure disorder for which he is compliant w/ Tegretol, presents w/ c/o seizure and subsequent head trauma.  Pt drowsy but oriented x 3, following commands and  no focal neuro deficits on exam.  Tachycardic at ~125bmp.  2 lacerations left lateral forehead which have been cleaned by nursing staff and sutured by myself.  Tetanus updated.  CT maxillofacial ordered to r/o orbital fx d/t periorbital edema/ecchymosis and pain w/ EOM mvnt but neg.  Pt received 1mg  ativan and 1L bolus NS.  HR has improved to 99 and he is more alert.  Labs unremarkable.  Pt discharged home w/ return precautions including signs of wound infection and TBI.             Otilio Miu, PA 06/18/11 1718  Otilio Miu, PA 06/18/11 2024

## 2011-06-18 NOTE — ED Notes (Signed)
RUE:[id number]<JW> Expected date:<BR> Expected time: 1:12 PM<BR> Means of arrival:<BR> Comments:<BR> M61 - 26yoM Seizure while playing bball, hx of same.  Off meds

## 2011-06-18 NOTE — ED Notes (Signed)
Patient given discharge instructions, information, prescriptions, and diet order. Patient states that they adequately understand discharge information given and to return to ED if symptoms return or worsen.     

## 2011-06-18 NOTE — ED Notes (Signed)
Per EMS: patient was playing basketball and had witnessed seizure, which he is on medications for. Patient vomited once on scene, did not fall but has laceration to his right forehead from tonic clonic seizure activity. Patient presents with blood to his clothing.

## 2011-06-19 NOTE — ED Provider Notes (Signed)
Medical screening examination/treatment/procedure(s) were performed by non-physician practitioner and as supervising physician I was immediately available for consultation/collaboration.  Juliet Rude. Rubin Payor, MD 06/19/11 [phone]

## 2012-09-12 ENCOUNTER — Telehealth: Payer: Self-pay | Admitting: Nurse Practitioner

## 2012-09-12 MED ORDER — CARBAMAZEPINE 200 MG PO TABS: 400.0000 mg | ORAL_TABLET | Freq: Two times a day (BID) | ORAL | Status: DC

## 2012-09-12 NOTE — Telephone Encounter (Signed)
I do not have a request from pharmacy.  I called the patient to see what medication he needs filled.  He said Carbamazepine.  Patient has CB and has not been seen in one year.  I advised him we will send one refill, but he will need to speak with our accounting dept and as well sched appt.  He verbalized understanding.

## 2012-10-10 ENCOUNTER — Telehealth: Payer: Self-pay | Admitting: Nurse Practitioner

## 2012-10-17 ENCOUNTER — Other Ambulatory Visit: Payer: Self-pay

## 2012-10-17 MED ORDER — CARBAMAZEPINE 200 MG PO TABS: 400.0000 mg | ORAL_TABLET | Freq: Two times a day (BID) | ORAL | Status: DC

## 2012-10-25 ENCOUNTER — Encounter: Payer: Self-pay | Admitting: Neurology

## 2012-10-25 DIAGNOSIS — G40209 Localization-related (focal) (partial) symptomatic epilepsy and epileptic syndromes with complex partial seizures, not intractable, without status epilepticus: Secondary | ICD-10-CM | POA: Insufficient documentation

## 2012-10-25 DIAGNOSIS — I10 Essential (primary) hypertension: Secondary | ICD-10-CM | POA: Insufficient documentation

## 2012-11-01 ENCOUNTER — Encounter: Payer: Self-pay | Admitting: Neurology

## 2012-11-22 ENCOUNTER — Encounter: Payer: Self-pay | Admitting: Neurology

## 2012-11-22 ENCOUNTER — Ambulatory Visit (INDEPENDENT_AMBULATORY_CARE_PROVIDER_SITE_OTHER): Payer: Self-pay | Admitting: Neurology

## 2012-11-22 VITALS — BP 108/75 | HR 71 | Temp 97.9°F | Ht 74.0 in | Wt 203.5 lb

## 2012-11-22 DIAGNOSIS — G40209 Localization-related (focal) (partial) symptomatic epilepsy and epileptic syndromes with complex partial seizures, not intractable, without status epilepticus: Secondary | ICD-10-CM

## 2012-11-22 NOTE — Progress Notes (Signed)
[facility] [address] [place]. [place] [address] [phone]       OFFICE FOLLOW-UP NOTE  Mr. Calvin [name] Date of Birth:  [birth year] Medical Record Number:  [id number]   HPI:  32 year male with long standing h/o partialcomplex seizure disorder who is stable 11/22/12  He returns for f/u after last visit 08/10/11.He is doing well without any seizures since Feb 2013. He is tolerating tegretol 400mg  twice daily well without side effects. He had carbamezapine level drawn on 08/10/11 which was 6.8 mg% and normal CBC and BMP he has no new complaints.he is working a third shift job but states he is getting enough sleep. ROS:   14 system review of systems is positive for sleepiness only  PMH:  Past Medical History  Diagnosis Date  . Seizures   . Hypertension     Social History:  History   Social History  . Marital Status: Single    Spouse Name: [name]    Number of Children: 0  . Years of Education: BA   Occupational History  .      [employer]   Social History Main Topics  . Smoking status: Never Smoker   . Smokeless tobacco: Never Used  . Alcohol Use: No  . Drug Use: No  . Sexually Active: Yes   Other Topics Concern  . Not on file   Social History Narrative   his is a 32 year old gentleman. He has never used caffeine, tobacco, alcohol or drugs. He has been married for 3 yrs and has no children. He lives at home with his wife.    Medications:   Current Outpatient Prescriptions on File Prior to Visit  Medication Sig Dispense Refill  . carbamazepine (TEGRETOL) 200 MG tablet Take 2 tablets (400 mg total) by mouth 2 (two) times daily.  120 tablet  1   No current facility-administered medications on file prior to visit.    Allergies:  No Known Allergies Filed Vitals:   11/22/12 1443  BP: 108/75  Pulse: 71  Temp: 97.9 F (36.6 C)    Physical Exam General: well developed, well nourished, seated, in no evident distress Head: head  normocephalic and atraumatic. Orohparynx benign Neck: supple with no carotid or supraclavicular bruits Cardiovascular: regular rate and rhythm, no murmurs Musculoskeletal: no deformity Skin:  no rash/petichiae Vascular:  Normal pulses all extremities  Neurologic Exam Mental Status: Awake and fully alert. Oriented to place and time. Recent and remote memory intact. Attention span, concentration and fund of knowledge appropriate. Mood and affect appropriate.  Cranial Nerves: Fundoscopic exam reveals sharp disc margins. Pupils equal, briskly reactive to light. Extraocular movements full without nystagmus. Visual fields full to confrontation. Hearing intact. Facial sensation intact. Face, tongue, palate moves normally and symmetrically.  Motor: Normal bulk and tone. Normal strength in all tested extremity muscles. Sensory.: intact to tough and pinprick and vibratory.  Coordination: Rapid alternating movements normal in all extremities. Finger-to-nose and heel-to-shin performed accurately bilaterally. Gait and Station: Arises from chair without difficulty. Stance is normal. Gait demonstrates normal stride length and balance . Able to heel, toe and tandem walk without difficulty.  Reflexes: 1+ and symmetric. Toes downgoing.     ASSESSMENT:  32 year male with long standing h/o partialcomplex seizure disorder who is stable   PLAN: He was advised to continue carbamazepine 400 mg twice daily. Check carbamazepine level, CBC and BMP today. He was advised to avoid seizure provoking triggers. Return for followup in  6 months with Darrol Angel, NP

## 2012-11-22 NOTE — Patient Instructions (Addendum)
He was advised to continue carbamazepine 400 mg twice daily. Check carbamazepine level, CBC and BMP today. He was advised to avoid seizure provoking triggers. Return for followup in 6 months with Darrol Angel, NP

## 2012-11-23 LAB — CBC
Hemoglobin: 15 g/dL (ref 12.6–17.7)
Platelets: 187 10*3/uL (ref 150–379)
RBC: 5.24 x10E6/uL (ref 4.14–5.80)
WBC: 3.5 10*3/uL (ref 3.4–10.8)

## 2012-11-23 LAB — BASIC METABOLIC PANEL
BUN: 14 mg/dL (ref 6–20)
Calcium: 9.2 mg/dL (ref 8.7–10.2)
Creatinine, Ser: 0.85 mg/dL (ref 0.76–1.27)
GFR calc Af Amer: 134 mL/min/{1.73_m2} (ref >59)
GFR calc non Af Amer: 116 mL/min/{1.73_m2} (ref >59)
Glucose: 92 mg/dL (ref 65–99)

## 2012-12-24 ENCOUNTER — Other Ambulatory Visit: Payer: Self-pay | Admitting: Neurology

## 2013-05-27 ENCOUNTER — Encounter (INDEPENDENT_AMBULATORY_CARE_PROVIDER_SITE_OTHER): Payer: Self-pay

## 2013-05-27 ENCOUNTER — Ambulatory Visit (INDEPENDENT_AMBULATORY_CARE_PROVIDER_SITE_OTHER): Payer: Self-pay | Admitting: Nurse Practitioner

## 2013-05-27 ENCOUNTER — Encounter: Payer: Self-pay | Admitting: Nurse Practitioner

## 2013-05-27 VITALS — BP 111/76 | HR 63 | Ht 74.02 in | Wt 219.0 lb

## 2013-05-27 DIAGNOSIS — R569 Unspecified convulsions: Secondary | ICD-10-CM

## 2013-05-27 DIAGNOSIS — G40209 Localization-related (focal) (partial) symptomatic epilepsy and epileptic syndromes with complex partial seizures, not intractable, without status epilepticus: Secondary | ICD-10-CM

## 2013-05-27 MED ORDER — CARBAMAZEPINE 200 MG PO TABS: 400.0000 mg | ORAL_TABLET | Freq: Two times a day (BID) | ORAL | Status: DC

## 2013-05-27 NOTE — Progress Notes (Signed)
[facility]  PATIENT: Calvin [name] DOB: [birth year]   REASON FOR VISIT: Followup for seizure disorder    HISTORY OF PRESENT ILLNESS: [name], 33 year old male returns for followup. He has a long-standing history of partial complex seizure disorder with his last seizure occurring in February 2013. He is tolerating his Tegretol 400 mg twice daily without side effects. His last level drawn on 11/22/2012 was 8. CBC and BMP  were within normal limits. He continues to work third shift. He returns for reevaluation.  HISTORY: Partial complex seizure disorder with last seizure occurring February 2013. Seizures in the past  are due to noncompliance with his medications with seizure activity occurring.  He was on Keppra and Depakote however he has been switched to carbamazepine by the emergency room in 2010.     He does not have PCP.   REVIEW OF SYSTEMS: Full 14 system review of systems performed and notable only for those listed, all others are neg:  Constitutional: N/A  Cardiovascular: N/A  Ear/Nose/Throat: N/A  Skin: N/A  Eyes: N/A  Respiratory: N/A  Gastroitestinal: N/A  Hematology/Lymphatic: N/A  Endocrine: N/A Musculoskeletal:N/A  Allergy/Immunology: N/A  Neurological: N/A Psychiatric: N/A   ALLERGIES: No Known Allergies  HOME MEDICATIONS: Outpatient Prescriptions Prior to Visit  Medication Sig Dispense Refill  . carbamazepine (TEGRETOL) 200 MG tablet TAKE 2 TABLETS (400 MG TOTAL) BY MOUTH 2 (TWO) TIMES DAILY.  120 tablet  4   No facility-administered medications prior to visit.    PAST MEDICAL HISTORY: Past Medical History  Diagnosis Date  . Seizures   . Hypertension     PAST SURGICAL HISTORY: Past Surgical History  Procedure Laterality Date  . Tonsillectomy      FAMILY HISTORY: History reviewed. No pertinent family history.  SOCIAL HISTORY: History   Social History  . Marital Status: Single    Spouse Name: [name]    Number of  Children: 0  . Years of Education: BA   Occupational History  .      [employer]   Social History Main Topics  . Smoking status: Never Smoker   . Smokeless tobacco: Never Used  . Alcohol Use: No  . Drug Use: No  . Sexual Activity: Yes   Other Topics Concern  . Not on file   Social History Narrative   his is a 33 year old gentleman. He has never used caffeine, tobacco, alcohol or drugs. He has been married for 3 yrs and has no children. He lives at home with his wife.     PHYSICAL EXAM  Filed Vitals:   05/27/13 1339  BP: 111/76  Pulse: 63  Height: 6' 2.02" (1.88 m)  Weight: 219 lb (99.338 kg)   Body mass index is 28.11 kg/(m^2).  Generalized: Well developed, in no acute distress   Neurological examination   Mentation: Alert oriented to time, place, history taking. Follows all commands speech and language fluent  Cranial nerve II-XII: Pupils were equal round reactive to light extraocular movements were full, visual field were full on confrontational test. Facial sensation and strength were normal. hearing was intact to finger rubbing bilaterally. Uvula tongue midline. head turning and shoulder shrug were normal and symmetric.Tongue protrusion into cheek strength was normal. Motor: normal bulk and tone, full strength in the BUE, BLE, fine finger movements normal, no pronator drift. No focal weakness Coordination: finger-nose-finger, heel-to-shin bilaterally, no dysmetria Reflexes: 1+ upper lower and symmetric Gait and Station: Rising up from seated position without assistance,  normal stance,  moderate stride, good arm swing, smooth turning, able to perform tiptoe, and heel walking without difficulty. Tandem gait is steady  DIAGNOSTIC DATA (LABS, IMAGING, TESTING) - I reviewed patient records, labs, notes, testing and imaging myself where available.  Lab Results  Component Value Date   WBC 3.5 11/22/2012   HGB 15.0 11/22/2012   HCT 44.0 11/22/2012   MCV 84 11/22/2012    PLT 187 11/22/2012      Component Value Date/Time   NA 142 11/22/2012 1528   NA 140 06/18/2011 1440   K 4.1 11/22/2012 1528   CL 103 11/22/2012 1528   CO2 22 11/22/2012 1528   GLUCOSE 92 11/22/2012 1528   GLUCOSE 106* 06/18/2011 1440   BUN 14 11/22/2012 1528   BUN 22 06/18/2011 1440   CREATININE 0.85 11/22/2012 1528   CALCIUM 9.2 11/22/2012 1528   PROT 7.7 05/06/2008 0910   ALBUMIN 4.0 05/06/2008 0910   AST 54* 05/06/2008 0910   ALT 64* 05/06/2008 0910   ALKPHOS 51 05/06/2008 0910   BILITOT 0.6 05/06/2008 0910   GFRNONAA 116 11/22/2012 1528   GFRAA 134 11/22/2012 1528   ASSESSMENT AND PLAN  33 y.o. year old male  has a past medical history of Seizures here to followup. Last seizure occurred in February 2013.   Continue carbamazepine 400 mg twice daily Will renew Labs reviewed from 6 months ago all within normal limits Followup yearly and when necessary Call for any seizure activity Nilda Riggs, [facility], [facility], APRN  [facility] [address], [address] [place], [place] [address] [phone]

## 2013-05-27 NOTE — Patient Instructions (Signed)
Continue carbamazepine 400 mg twice daily Will renew Labs reviewed from 6 months ago all within normal limits Followup yearly and when necessary Call for any seizure activity

## 2014-01-28 ENCOUNTER — Telehealth: Payer: Self-pay | Admitting: Neurology

## 2014-01-28 NOTE — Telephone Encounter (Signed)
Patient called requesting a letter for withdrawal from school because he had a seizure last night and is now unable to drive for 6 months.  I relayed that he will have to pay the fee first before letter can be written.  He attends [employer] and has a form that he has to fill out, I asked that he give [place] a copy of this form so we know to whom to address the letter.

## 2014-01-29 ENCOUNTER — Telehealth: Payer: Self-pay | Admitting: *Deleted

## 2014-01-29 NOTE — Telephone Encounter (Signed)
Patient stated he will stop by today to pay the fee for his letter.

## 2014-02-04 NOTE — Telephone Encounter (Signed)
I spoke to billing in our office and nothing has been paid yet, also the patient is in collections, has a balance.  Calvin [name] has left a message for patient also.  I will close encounter until payment is received.

## 2014-05-26 ENCOUNTER — Ambulatory Visit: Payer: Self-pay | Admitting: Nurse Practitioner

## 2014-05-27 ENCOUNTER — Other Ambulatory Visit: Payer: Self-pay | Admitting: Nurse Practitioner

## 2014-06-04 ENCOUNTER — Other Ambulatory Visit: Payer: Self-pay | Admitting: Nurse Practitioner

## 2014-06-04 NOTE — Telephone Encounter (Signed)
I called the pharmacy and spoke with Dorene GrebeNatalie who said they already have Rx on file.  Asked that we disregard this request.

## 2014-07-02 ENCOUNTER — Other Ambulatory Visit: Payer: Self-pay | Admitting: Neurology

## 2014-08-02 ENCOUNTER — Other Ambulatory Visit: Payer: Self-pay | Admitting: Neurology

## 2014-08-04 NOTE — Telephone Encounter (Signed)
Cancelled last appt.

## 2014-08-20 ENCOUNTER — Encounter: Payer: Self-pay | Admitting: Nurse Practitioner

## 2014-08-20 ENCOUNTER — Ambulatory Visit (INDEPENDENT_AMBULATORY_CARE_PROVIDER_SITE_OTHER): Payer: BC Managed Care – PPO | Admitting: Nurse Practitioner

## 2014-08-20 VITALS — BP 124/79 | HR 81 | Ht 74.0 in | Wt 251.0 lb

## 2014-08-20 DIAGNOSIS — Z5181 Encounter for therapeutic drug level monitoring: Secondary | ICD-10-CM | POA: Diagnosis not present

## 2014-08-20 DIAGNOSIS — G40909 Epilepsy, unspecified, not intractable, without status epilepticus: Secondary | ICD-10-CM | POA: Diagnosis not present

## 2014-08-20 DIAGNOSIS — G40209 Localization-related (focal) (partial) symptomatic epilepsy and epileptic syndromes with complex partial seizures, not intractable, without status epilepticus: Secondary | ICD-10-CM | POA: Diagnosis not present

## 2014-08-20 MED ORDER — CARBAMAZEPINE 200 MG PO TABS: 400.0000 mg | ORAL_TABLET | Freq: Two times a day (BID) | ORAL | Status: DC

## 2014-08-20 NOTE — Progress Notes (Signed)
I agree with the above plan 

## 2014-08-20 NOTE — Patient Instructions (Signed)
Continue Tegretol at current dose will refill Check labs today Follow-up yearly and when necessary

## 2014-08-20 NOTE — Progress Notes (Signed)
[facility]  PATIENT: Calvin [name] DOB: 09-07-1980   REASON FOR VISIT: Follow-up for seizure disorder  HISTORY FROM: Patient    HISTORY OF PRESENT ILLNESS:Mr. Calvin [name], 34 year-old male returns for followup. He was last seen in this office 05/27/2013. He has a long-standing history of partial complex seizure disorder with his last seizure occurring in September  2015. This seizure occurred in his sleep and lasted approximately 1 minute according to his wife. He is tolerating his Tegretol 400 mg twice daily without side effects.  He no longer works third shift. He has a job with [employer] working with the [employer]. He needs refills on his medications and labs to monitor side effects. He returns for reevaluation.  HISTORY: Partial complex seizure disorder with last seizure occurring February 2013. Seizures in the past are due to noncompliance with his medications with seizure activity occurring. He was on Keppra and Depakote however he has been switched to carbamazepine by the emergency room in 2010. He does not have PCP.     REVIEW OF SYSTEMS: Full 14 system review of systems performed and notable only for those listed, all others are neg:  Constitutional: neg  Cardiovascular: neg Ear/Nose/Throat: neg  Skin: neg Eyes: neg Respiratory: neg Gastroitestinal: neg  Hematology/Lymphatic: neg  Endocrine: neg Musculoskeletal:neg Allergy/Immunology: neg Neurological: Occasional headache Psychiatric: neg Sleep : neg   ALLERGIES: No Known Allergies  HOME MEDICATIONS: Outpatient Prescriptions Prior to Visit  Medication Sig Dispense Refill  . carbamazepine (TEGRETOL) 200 MG tablet TAKE 2 TABLETS TWICE A DAY 12 tablet 0   No facility-administered medications prior to visit.    PAST MEDICAL HISTORY: Past Medical History  Diagnosis Date  . Seizures   . Hypertension     PAST SURGICAL HISTORY: Past Surgical History  Procedure  Laterality Date  . Tonsillectomy      FAMILY HISTORY: Family History  Problem Relation Age of Onset  . Healthy Mother     SOCIAL HISTORY: History   Social History  . Marital Status: married    Spouse Name: [name]  . Number of Children: 0  . Years of Education: BA   Occupational History  .      [employer] program in the school system   Social History Main Topics  . Smoking status: Never Smoker   . Smokeless tobacco: Never Used  . Alcohol Use: No  . Drug Use: No  . Sexual Activity: Yes   Other Topics Concern  . Not on file   Social History Narrative    34 year old gentleman. He has never used caffeine, tobacco, alcohol or drugs. He has been married for 3 yrs and has no children. He lives at home with his wife.  PHYSICAL EXAM  Filed Vitals:   08/20/14 0758  BP: 124/79  Pulse: 81  Height: 6\' 2"  (1.88 m)  Weight: 251 lb (113.853 kg)   Body mass index is 32.21 kg/(m^2). Generalized: Well developed, obese male in no acute distress   Neurological examination   Mentation: Alert oriented to time, place, history taking. Follows all commands speech and language fluent  Cranial nerve II-XII: Pupils were equal round reactive to light extraocular movements were full, visual field were full on confrontational test. Facial sensation and strength were normal. hearing was intact to finger rubbing bilaterally. Uvula tongue midline. head turning and shoulder shrug were normal and symmetric.Tongue protrusion into cheek strength was normal. Motor: normal bulk and tone, full strength in the BUE,  BLE, fine finger movements normal, no pronator drift. No focal weakness Coordination: finger-nose-finger, heel-to-shin bilaterally, no dysmetria Reflexes: 1+ upper lower and symmetric Gait and Station: Rising up from seated position without assistance, normal stance, moderate stride, good arm swing, smooth turning, able to perform tiptoe, and heel walking without difficulty. Tandem gait is  steady   DIAGNOSTIC DATA (LABS, IMAGING, TESTING) -  ASSESSMENT AND PLAN  34 y.o. year old male  has a past medical history of partial complex seizures  here to follow-up.  Continue Tegretol at current dose will refill Check labs today, CBC, CMP and CBZ level Follow-up yearly and when necessary Nilda Riggs, [facility], [facility], APRN  [facility] [address], [address] [place], [place] [address] [phone]

## 2014-08-21 ENCOUNTER — Telehealth: Payer: Self-pay | Admitting: Nurse Practitioner

## 2014-08-21 DIAGNOSIS — G40909 Epilepsy, unspecified, not intractable, without status epilepticus: Secondary | ICD-10-CM

## 2014-08-21 DIAGNOSIS — Z5181 Encounter for therapeutic drug level monitoring: Secondary | ICD-10-CM

## 2014-08-21 LAB — CBC WITH DIFFERENTIAL/PLATELET
BASOS: 0 %
Basophils Absolute: 0 10*3/uL (ref 0.0–0.2)
EOS: 1 %
Eosinophils Absolute: 0 10*3/uL (ref 0.0–0.4)
HCT: 46.1 % (ref 37.5–51.0)
HEMOGLOBIN: 15.7 g/dL (ref 12.6–17.7)
Immature Grans (Abs): 0 10*3/uL (ref 0.0–0.1)
Immature Granulocytes: 0 %
LYMPHS ABS: 1.6 10*3/uL (ref 0.7–3.1)
Lymphs: 37 %
MCH: 29.2 pg (ref 26.6–33.0)
MCHC: 34.1 g/dL (ref 31.5–35.7)
MCV: 86 fL (ref 79–97)
MONOS ABS: 0.3 10*3/uL (ref 0.1–0.9)
Monocytes: 8 %
NEUTROS PCT: 54 %
Neutrophils Absolute: 2.3 10*3/uL (ref 1.4–7.0)
PLATELETS: 189 10*3/uL (ref 150–379)
RBC: 5.38 x10E6/uL (ref 4.14–5.80)
RDW: 14.2 % (ref 12.3–15.4)
WBC: 4.2 10*3/uL (ref 3.4–10.8)

## 2014-08-21 LAB — COMPREHENSIVE METABOLIC PANEL
ALK PHOS: 46 IU/L (ref 39–117)
ALT: 19 IU/L (ref 0–44)
AST: 18 IU/L (ref 0–40)
Albumin/Globulin Ratio: 1.7 (ref 1.1–2.5)
Albumin: 4.2 g/dL (ref 3.5–5.5)
BUN / CREAT RATIO: 16 (ref 8–19)
BUN: 19 mg/dL (ref 6–20)
Bilirubin Total: 0.2 mg/dL (ref 0.0–1.2)
CALCIUM: 8.9 mg/dL (ref 8.7–10.2)
CHLORIDE: 103 mmol/L (ref 97–108)
CO2: 23 mmol/L (ref 18–29)
CREATININE: 1.16 mg/dL (ref 0.76–1.27)
GFR calc Af Amer: 95 mL/min/{1.73_m2} (ref >59)
GFR calc non Af Amer: 82 mL/min/{1.73_m2} (ref >59)
GLOBULIN, TOTAL: 2.5 g/dL (ref 1.5–4.5)
Glucose: 99 mg/dL (ref 65–99)
POTASSIUM: 4.6 mmol/L (ref 3.5–5.2)
SODIUM: 140 mmol/L (ref 134–144)
Total Protein: 6.7 g/dL (ref 6.0–8.5)

## 2014-08-21 LAB — CARBAMAZEPINE LEVEL, TOTAL: Carbamazepine Lvl: 1.7 ug/mL — ABNORMAL LOW (ref 4.0–12.0)

## 2014-08-21 MED ORDER — CARBAMAZEPINE ER 200 MG PO CP12: 200.0000 mg | ORAL_CAPSULE | Freq: Two times a day (BID) | ORAL | Status: DC

## 2014-08-21 MED ORDER — CARBAMAZEPINE ER 300 MG PO CP12: 300.0000 mg | ORAL_CAPSULE | Freq: Two times a day (BID) | ORAL | Status: DC

## 2014-08-21 NOTE — Telephone Encounter (Signed)
Telephone call to patient. Made aware  CBC and CMP are normal. Carbamazepine level is low at 1.7. Will increase carbamazepine to 500 mg twice daily from 400 mg twice daily. Will call in 300 mg tablet to take along with the 200 mg tablet. Will recheck repeat labs in 2 weeks. Patient agreeable to the above

## 2014-08-21 NOTE — Telephone Encounter (Signed)
I called the pharmacy.  Spoke with Dorene GrebeNatalie.  Advised we would be adjusting the patient's dose on Carbamazepine.  She said patient already picked up the Rx from yesterday, but she has cancelled the refills.  Carbamazepine has been updated in chart, however 300mg  is only available in capsules (generic carbatrol).  Also added 200mg  caps so total dose would equal 500mg  twice daily.

## 2015-01-16 ENCOUNTER — Ambulatory Visit: Payer: BC Managed Care – PPO | Admitting: Podiatry

## 2015-01-29 ENCOUNTER — Ambulatory Visit (INDEPENDENT_AMBULATORY_CARE_PROVIDER_SITE_OTHER): Payer: BC Managed Care – PPO | Admitting: Podiatry

## 2015-01-29 ENCOUNTER — Ambulatory Visit (INDEPENDENT_AMBULATORY_CARE_PROVIDER_SITE_OTHER): Payer: BC Managed Care – PPO

## 2015-01-29 ENCOUNTER — Encounter: Payer: Self-pay | Admitting: Podiatry

## 2015-01-29 VITALS — BP 117/79 | HR 71 | Resp 16

## 2015-01-29 DIAGNOSIS — M79672 Pain in left foot: Secondary | ICD-10-CM

## 2015-01-29 DIAGNOSIS — R609 Edema, unspecified: Secondary | ICD-10-CM

## 2015-01-29 NOTE — Progress Notes (Signed)
   Subjective:    Patient ID: Calvin [name], male    DOB: 1981/05/03, 34 y.o.   MRN: [id number]  HPI Pt presents with swelling in left leg, denies pain. Swelling has worsened over past few weeks. 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 Denies pain in left foot a this time   Review of Systems  Cardiovascular: Positive for leg swelling.  All other systems reviewed and are negative.      Objective:   Physical Exam        Assessment & Plan:

## 2015-01-30 NOTE — Progress Notes (Signed)
Subjective:     Patient ID: [name], male   DOB: 1980-10-14, 34 y.o.   MRN: [id number]  HPI patient states that had swelling in my left leg that's been present for a number of years but my foot seems to be swelling more and it's just uncomfortable. I've not had any pain in my leg   Review of Systems  All other systems reviewed and are negative.      Objective:   Physical Exam  Constitutional: He is oriented to person, place, and time.  Cardiovascular: Intact distal pulses.   Musculoskeletal: Normal range of motion.  Neurological: He is oriented to person, place, and time.  Skin: Skin is warm.  Nursing note and vitals reviewed.  neurovascular status intact muscle strength adequate range of motion within normal limits with patient found to have +1 pitting edema in the left forefoot and into the midfoot and ankle. Negative Homan sign was noted and no indications of any other systemic issues     Assessment:     Localized edema most likely related to either venous or lymphatic disease    Plan:     H&P and x-rays of this foot were reviewed. Today I applied and anklet for compression and instructed on elevation and if any pain were to occur he may have clot and he is to go straight to the emergency room. This has been present for a long time so very unlikely that this is going to be a problem

## 2015-05-27 ENCOUNTER — Emergency Department (HOSPITAL_COMMUNITY)
Admission: EM | Admit: 2015-05-27 | Discharge: 2015-05-28 | Disposition: A | Discharge status: Home / Self Care | Payer: BLUE CROSS/BLUE SHIELD | Source: Home / Self Care | Attending: Emergency Medicine | Admitting: Emergency Medicine

## 2015-05-27 ENCOUNTER — Encounter (HOSPITAL_COMMUNITY): Payer: Self-pay | Admitting: Emergency Medicine

## 2015-05-27 DIAGNOSIS — S0081XA Abrasion of other part of head, initial encounter: Secondary | ICD-10-CM

## 2015-05-27 DIAGNOSIS — Y929 Unspecified place or not applicable: Secondary | ICD-10-CM

## 2015-05-27 DIAGNOSIS — Y999 Unspecified external cause status: Secondary | ICD-10-CM | POA: Insufficient documentation

## 2015-05-27 DIAGNOSIS — Z9114 Patient's other noncompliance with medication regimen: Secondary | ICD-10-CM

## 2015-05-27 DIAGNOSIS — Z23 Encounter for immunization: Secondary | ICD-10-CM

## 2015-05-27 DIAGNOSIS — S0083XA Contusion of other part of head, initial encounter: Secondary | ICD-10-CM | POA: Insufficient documentation

## 2015-05-27 DIAGNOSIS — X58XXXA Exposure to other specified factors, initial encounter: Secondary | ICD-10-CM | POA: Insufficient documentation

## 2015-05-27 DIAGNOSIS — I10 Essential (primary) hypertension: Secondary | ICD-10-CM

## 2015-05-27 DIAGNOSIS — Y939 Activity, unspecified: Secondary | ICD-10-CM | POA: Insufficient documentation

## 2015-05-27 DIAGNOSIS — R7989 Other specified abnormal findings of blood chemistry: Secondary | ICD-10-CM | POA: Diagnosis not present

## 2015-05-27 DIAGNOSIS — Z79899 Other long term (current) drug therapy: Secondary | ICD-10-CM

## 2015-05-27 DIAGNOSIS — R569 Unspecified convulsions: Secondary | ICD-10-CM

## 2015-05-27 DIAGNOSIS — Z87828 Personal history of other (healed) physical injury and trauma: Secondary | ICD-10-CM | POA: Insufficient documentation

## 2015-05-27 NOTE — ED Notes (Signed)
Pt presents from home with [facility] for witnessed seizures x 3 in 10-15min span of time; EMS reports patient was postictal upon their arrival on scene; denies mouth/facial trauma or incontinence; pt CAOx4 and follows commands at this time; pt c/o neck tenderness therefore c-collar in place by EMS;

## 2015-05-28 ENCOUNTER — Emergency Department (HOSPITAL_COMMUNITY): Payer: BLUE CROSS/BLUE SHIELD

## 2015-05-28 ENCOUNTER — Telehealth: Payer: Self-pay | Admitting: Nurse Practitioner

## 2015-05-28 ENCOUNTER — Encounter (HOSPITAL_COMMUNITY): Payer: Self-pay | Admitting: Emergency Medicine

## 2015-05-28 ENCOUNTER — Emergency Department (HOSPITAL_COMMUNITY)
Admission: EM | Admit: 2015-05-28 | Discharge: 2015-05-28 | Disposition: A | Discharge status: Home / Self Care | Payer: BLUE CROSS/BLUE SHIELD | Attending: Emergency Medicine | Admitting: Emergency Medicine

## 2015-05-28 DIAGNOSIS — R569 Unspecified convulsions: Secondary | ICD-10-CM | POA: Insufficient documentation

## 2015-05-28 DIAGNOSIS — R7989 Other specified abnormal findings of blood chemistry: Secondary | ICD-10-CM

## 2015-05-28 DIAGNOSIS — Z79899 Other long term (current) drug therapy: Secondary | ICD-10-CM | POA: Insufficient documentation

## 2015-05-28 DIAGNOSIS — G40919 Epilepsy, unspecified, intractable, without status epilepticus: Secondary | ICD-10-CM

## 2015-05-28 DIAGNOSIS — I10 Essential (primary) hypertension: Secondary | ICD-10-CM | POA: Insufficient documentation

## 2015-05-28 LAB — CBC WITH DIFFERENTIAL/PLATELET
BASOS PCT: 0 %
Basophils Absolute: 0 10*3/uL (ref 0.0–0.1)
EOS ABS: 0.1 10*3/uL (ref 0.0–0.7)
EOS PCT: 1 %
HCT: 47.2 % (ref 39.0–52.0)
HEMOGLOBIN: 15.9 g/dL (ref 13.0–17.0)
LYMPHS ABS: 2.5 10*3/uL (ref 0.7–4.0)
Lymphocytes Relative: 38 %
MCH: 28.9 pg (ref 26.0–34.0)
MCHC: 33.7 g/dL (ref 30.0–36.0)
MCV: 85.7 fL (ref 78.0–100.0)
MONOS PCT: 8 %
Monocytes Absolute: 0.5 10*3/uL (ref 0.1–1.0)
NEUTROS PCT: 53 %
Neutro Abs: 3.6 10*3/uL (ref 1.7–7.7)
PLATELETS: 202 10*3/uL (ref 150–400)
RBC: 5.51 MIL/uL (ref 4.22–5.81)
RDW: 12.6 % (ref 11.5–15.5)
WBC: 6.7 10*3/uL (ref 4.0–10.5)

## 2015-05-28 LAB — CBG MONITORING, ED
GLUCOSE-CAPILLARY: 91 mg/dL (ref 65–99)
GLUCOSE-CAPILLARY: 96 mg/dL (ref 65–99)

## 2015-05-28 LAB — URINALYSIS, ROUTINE W REFLEX MICROSCOPIC
Bilirubin Urine: NEGATIVE
GLUCOSE, UA: NEGATIVE mg/dL
KETONES UR: NEGATIVE mg/dL
LEUKOCYTES UA: NEGATIVE
NITRITE: NEGATIVE
PH: 5.5 (ref 5.0–8.0)
Protein, ur: 30 mg/dL — AB
Specific Gravity, Urine: 1.018 (ref 1.005–1.030)

## 2015-05-28 LAB — BASIC METABOLIC PANEL
ANION GAP: 16 — AB (ref 5–15)
BUN: 15 mg/dL (ref 6–20)
CALCIUM: 9.3 mg/dL (ref 8.9–10.3)
CO2: 20 mmol/L — AB (ref 22–32)
Chloride: 106 mmol/L (ref 101–111)
Creatinine, Ser: 1.73 mg/dL — ABNORMAL HIGH (ref 0.61–1.24)
GFR, EST AFRICAN AMERICAN: 58 mL/min — AB (ref >60)
GFR, EST NON AFRICAN AMERICAN: 50 mL/min — AB (ref >60)
Glucose, Bld: 107 mg/dL — ABNORMAL HIGH (ref 65–99)
POTASSIUM: 4.3 mmol/L (ref 3.5–5.1)
Sodium: 142 mmol/L (ref 135–145)

## 2015-05-28 LAB — URINE MICROSCOPIC-ADD ON: WBC, UA: NONE SEEN WBC/hpf (ref 0–5)

## 2015-05-28 LAB — RAPID URINE DRUG SCREEN, HOSP PERFORMED
AMPHETAMINES: NOT DETECTED
BARBITURATES: NOT DETECTED
BENZODIAZEPINES: NOT DETECTED
COCAINE: NOT DETECTED
Opiates: NOT DETECTED
Tetrahydrocannabinol: NOT DETECTED

## 2015-05-28 LAB — COMPREHENSIVE METABOLIC PANEL
ALBUMIN: 4.2 g/dL (ref 3.5–5.0)
ALT: 35 U/L (ref 17–63)
ANION GAP: 15 (ref 5–15)
AST: 36 U/L (ref 15–41)
Alkaline Phosphatase: 51 U/L (ref 38–126)
BUN: 20 mg/dL (ref 6–20)
CALCIUM: 9.7 mg/dL (ref 8.9–10.3)
CHLORIDE: 104 mmol/L (ref 101–111)
CO2: 23 mmol/L (ref 22–32)
Creatinine, Ser: 1.61 mg/dL — ABNORMAL HIGH (ref 0.61–1.24)
GFR calc non Af Amer: 55 mL/min — ABNORMAL LOW (ref >60)
GLUCOSE: 95 mg/dL (ref 65–99)
POTASSIUM: 4.1 mmol/L (ref 3.5–5.1)
SODIUM: 142 mmol/L (ref 135–145)
Total Bilirubin: 0.4 mg/dL (ref 0.3–1.2)
Total Protein: 7.1 g/dL (ref 6.5–8.1)

## 2015-05-28 LAB — CARBAMAZEPINE LEVEL, TOTAL
CARBAMAZEPINE LVL: 2.8 ug/mL — AB (ref 4.0–12.0)
Carbamazepine Lvl: <2 ug/mL — ABNORMAL LOW (ref 4.0–12.0)

## 2015-05-28 LAB — MAGNESIUM: Magnesium: 2.3 mg/dL (ref 1.7–2.4)

## 2015-05-28 MED ORDER — SODIUM CHLORIDE 0.9 % IV BOLUS (SEPSIS)
1000.0000 mL | Freq: Once | INTRAVENOUS | Status: AC
  Administered 2015-05-28: 1000 mL via INTRAVENOUS

## 2015-05-28 MED ORDER — CARBAMAZEPINE 200 MG PO TABS: 500.0000 mg | ORAL_TABLET | Freq: Once | ORAL | Status: DC

## 2015-05-28 MED ORDER — LACOSAMIDE 200 MG PO TABS
200.0000 mg | ORAL_TABLET | Freq: Once | ORAL | Status: AC
  Administered 2015-05-28: 200 mg via ORAL

## 2015-05-28 MED ORDER — ACETAMINOPHEN 500 MG PO TABS
1000.0000 mg | ORAL_TABLET | Freq: Once | ORAL | Status: AC
  Administered 2015-05-28: 1000 mg via ORAL
  Filled 2015-05-28: qty 2

## 2015-05-28 MED ORDER — CARBAMAZEPINE 200 MG PO TABS: 200.0000 mg | ORAL_TABLET | Freq: Once | ORAL | Status: DC

## 2015-05-28 MED ORDER — LACOSAMIDE 100 MG PO TABS
100.0000 mg | ORAL_TABLET | Freq: Two times a day (BID) | ORAL | Status: DC
Start: 2015-05-28 — End: 2015-06-05

## 2015-05-28 MED ORDER — CARBAMAZEPINE 200 MG PO TABS
200.0000 mg | ORAL_TABLET | Freq: Once | ORAL | Status: AC
  Administered 2015-05-28: 200 mg via ORAL
  Filled 2015-05-28: qty 1

## 2015-05-28 MED ORDER — TETANUS-DIPHTH-ACELL PERTUSSIS 5-2.5-18.5 LF-MCG/0.5 IM SUSP
0.5000 mL | Freq: Once | INTRAMUSCULAR | Status: AC
  Administered 2015-05-28: 0.5 mL via INTRAMUSCULAR
  Filled 2015-05-28: qty 0.5

## 2015-05-28 MED ORDER — CARBAMAZEPINE 200 MG PO TABS: 200.0000 mg | ORAL_TABLET | Freq: Two times a day (BID) | ORAL | Status: DC

## 2015-05-28 NOTE — Telephone Encounter (Signed)
LMVM again for pt to return call for appt.

## 2015-05-28 NOTE — ED Notes (Signed)
Pt here from home with c/o seizures , pt has history of same but has not had a seizure in 2 years , pt had one last night and one today according to wife

## 2015-05-28 NOTE — Discharge Instructions (Signed)
Thank you for allowing [place] to take care of you today.  Your creatinine was a little elevated today, I would like you to follow up with a primary care doctor within the next week for a re-check.  We would like to start you on Vimpat twice daily and have you follow up with your neurologist within the next two weeks  Epilepsy Epilepsy is a disorder in which a person has repeated seizures over time. A seizure is a release of abnormal electrical activity in the brain. Seizures can cause a change in attention, behavior, or the ability to remain awake and alert (altered mental status). Seizures often involve uncontrollable shaking (convulsions).  Most people with epilepsy lead normal lives. However, people with epilepsy are at an increased risk of falls, accidents, and injuries. Therefore, it is important to begin treatment right away. CAUSES  Epilepsy has many possible causes. Anything that disturbs the normal pattern of brain cell activity can lead to seizures. This may include:   Head injury.  Birth trauma.  High fever as a child.  Stroke.  Bleeding into or around the brain.  Certain drugs.  Prolonged low oxygen, such as what occurs after CPR efforts.  Abnormal brain development.  Certain illnesses, such as meningitis, encephalitis (brain infection), malaria, and other infections.  An imbalance of nerve signaling chemicals (neurotransmitters).  SIGNS AND SYMPTOMS  The symptoms of a seizure can vary greatly from one person to another. Right before a seizure, you may have a warning (aura) that a seizure is about to occur. An aura may include the following symptoms:  Fear or anxiety.  Nausea.  Feeling like the room is spinning (vertigo).  Vision changes, such as seeing flashing lights or spots. Common symptoms during a seizure include:  Abnormal sensations, such as an abnormal smell or a bitter taste in the mouth.   Sudden, general body stiffness.   Convulsions that involve  rhythmic jerking of the face, arm, or leg on one or both sides.   Sudden change in consciousness.   Appearing to be awake but not responding.   Appearing to be asleep but cannot be awakened.   Grimacing, chewing, lip smacking, drooling, tongue biting, or loss of bowel or bladder control. After a seizure, you may feel sleepy for a while. DIAGNOSIS  Your health care provider will ask about your symptoms and take a medical history. Descriptions from any witnesses to your seizures will be very helpful in the diagnosis. A physical exam, including a detailed neurological exam, is necessary. Various tests may be done, such as:   An electroencephalogram (EEG). This is a painless test of your brain waves. In this test, a diagram is created of your brain waves. These diagrams can be interpreted by a specialist.  An MRI of the brain.   A CT scan of the brain.   A spinal tap (lumbar puncture, LP).  Blood tests to check for signs of infection or abnormal blood chemistry. TREATMENT  There is no cure for epilepsy, but it is generally treatable. Once epilepsy is diagnosed, it is important to begin treatment as soon as possible. For most people with epilepsy, seizures can be controlled with medicines. The following may also be used:  A pacemaker for the brain (vagus nerve stimulator) can be used for people with seizures that are not well controlled by medicine.  Surgery on the brain. For some people, epilepsy eventually goes away. HOME CARE INSTRUCTIONS   Follow your health care provider's recommendations on driving  and safety in normal activities.  Get enough rest. Lack of sleep can cause seizures.  Only take over-the-counter or prescription medicines as directed by your health care provider. Take any prescribed medicine exactly as directed.  Avoid any known triggers of your seizures.  Keep a seizure diary. Record what you recall about any seizure, especially any possible trigger.    Make sure the people you live and work with know that you are prone to seizures. They should receive instructions on how to help you. In general, a witness to a seizure should:   Cushion your head and body.   Turn you on your side.   Avoid unnecessarily restraining you.   Not place anything inside your mouth.   Call for emergency medical help if there is any question about what has occurred.   Follow up with your health care provider as directed. You may need regular blood tests to monitor the levels of your medicine.  SEEK MEDICAL CARE IF:   You develop signs of infection or other illness. This might increase the risk of a seizure.   You seem to be having more frequent seizures.   Your seizure pattern is changing.  SEEK IMMEDIATE MEDICAL CARE IF:   You have a seizure that does not stop after a few moments.   You have a seizure that causes any difficulty in breathing.   You have a seizure that results in a very severe headache.   You have a seizure that leaves you with the inability to speak or use a part of your body.    This information is not intended to replace advice given to you by your health care provider. Make sure you discuss any questions you have with your health care provider.   Document Released: 04/25/2005 Document Revised: 02/13/2013 Document Reviewed: 12/05/2012 Elsevier Interactive Patient Education [employer]

## 2015-05-28 NOTE — Telephone Encounter (Signed)
Patient called to schedule appointment today for follow up from seizure last night

## 2015-05-28 NOTE — Discharge Instructions (Signed)
Please follow-up with your neurologist. Please call the morning to schedule an appointment in the next 1-2 weeks. Please begin taking your Tegretol 200 mg twice a day. You cannot drive, operate machinery or perform any other activity that may be dangerous to your self or others if you were to have another seizure until you have been cleared by your neurologist.   You also have some protein and blood in your urine and a mildly elevated kidney function. Your creatinine today was 1.6. Please drink plenty of water, avoid aspirin, Aleve, ibuprofen. I think you should follow-up with her primary care physician to follow this closely.    Seizure, Adult A seizure is abnormal electrical activity in the brain. Seizures usually last from 30 seconds to 2 minutes. There are various types of seizures. Before a seizure, you may have a warning sensation (aura) that a seizure is about to occur. An aura may include the following symptoms:   Fear or anxiety.  Nausea.  Feeling like the room is spinning (vertigo).  Vision changes, such as seeing flashing lights or spots. Common symptoms during a seizure include:  A change in attention or behavior (altered mental status).  Convulsions with rhythmic jerking movements.  Drooling.  Rapid eye movements.  Grunting.  Loss of bladder and bowel control.  Bitter taste in the mouth.  Tongue biting. After a seizure, you may feel confused and sleepy. You may also have an injury resulting from convulsions during the seizure. HOME CARE INSTRUCTIONS   If you are given medicines, take them exactly as prescribed by your health care provider.  Keep all follow-up appointments as directed by your health care provider.  Do not swim or drive or engage in risky activity during which a seizure could cause further injury to you or others until your health care provider says it is OK.  Get adequate rest.  Teach friends and family what to do if you have a seizure. They  should:  Lay you on the ground to prevent a fall.  Put a cushion under your head.  Loosen any tight clothing around your neck.  Turn you on your side. If vomiting occurs, this helps keep your airway clear.  Stay with you until you recover.  Know whether or not you need emergency care. SEEK IMMEDIATE MEDICAL CARE IF:  The seizure lasts longer than 5 minutes.  The seizure is severe or you do not wake up immediately after the seizure.  You have an altered mental status after the seizure.  You are having more frequent or worsening seizures. Someone should drive you to the emergency department or call local emergency services (911 in [place] MAKE SURE YOU:  Understand these instructions.  Will watch your condition.  Will get help right away if you are not doing well or get worse.   This information is not intended to replace advice given to you by your health care provider. Make sure you discuss any questions you have with your health care provider.   Document Released: 04/22/2000 Document Revised: 05/16/2014 Document Reviewed: 12/05/2012 Elsevier Interactive Patient Education 2016 [employer].    Emergency Department Resource Guide 1) Find a Doctor and Pay Out of Pocket Although you won't have to find out who is covered by your insurance plan, it is a good idea to ask around and get recommendations. You will then need to call the office and see if the doctor you have chosen will accept you as a new patient and what types of  options they offer for patients who are self-pay. Some doctors offer discounts or will set up payment plans for their patients who do not have insurance, but you will need to ask so you aren't surprised when you get to your appointment.  2) Contact Your Local Health Department Not all health departments have doctors that can see patients for sick visits, but many do, so it is worth a call to see if yours does. If you don't know where your local health  department is, you can check in your phone book. The CDC also has a tool to help you locate your state's health department, and many state websites also have listings of all of their local health departments.  3) Find a Walk-in Clinic If your illness is not likely to be very severe or complicated, you may want to try a walk in clinic. These are popping up all over the country in pharmacies, drugstores, and shopping centers. They're usually staffed by nurse practitioners or physician assistants that have been trained to treat common illnesses and complaints. They're usually fairly quick and inexpensive. However, if you have serious medical issues or chronic medical problems, these are probably not your best option.  No Primary Care Doctor: - Call Health Connect at  [phone] - they can help you locate a primary care doctor that  accepts your insurance, provides certain services, etc. - Physician Referral Service- [phone]  Chronic Pain Problems: Organization         Address  Phone   Notes  [facility]  [phone] Patients need to be referred by their primary care doctor.   Medication Assistance: Organization         Address  Phone   Notes  [facility] Medication [facility] [address] [place] [place], [place] [address] [phone] --Must be a resident of [facility] -- Must have NO insurance coverage whatsoever (no Medicaid/ Medicare, etc.) -- The pt. MUST have a primary care doctor that directs their care regularly and follows them in the community   [facility]ist  [phone]   [employer]  [phone]    Agencies that provide inexpensive medical care: Organization         Address  Phone   Notes  [facility]  [phone]   [facility]    [phone]   [facility] [address] [place], [place] [address] [phone]   [facility] [address] [place] [place] [phone]   Planned Parenthood    [phone]   [facility]    [phone]   Community Health and [facility]  [address], [place] Phone:  [phone], Fax:  [phone] Hours of Operation:  9 am - 6 pm, M-F.  Also accepts Medicaid/Medicare and self-pay.  [facility]  [address], [place] Phone: [phone], Fax: [phone]. Hours of Operation:  8:30 am - 5:30 pm, M-F.  Also accepts Medicaid and self-pay.  [facility] [place] [address], [place] Phone: [phone]   [facility] [address] Natasha Bence [place], [place] [phone], Ext. [phone] Mondays & Thursdays: 7-9 AM.  First 15 patients are seen on a first come, first serve basis.    Medicaid-accepting [facility] Providers:  Organization         Address  Phone   Notes  [facility] [address], [place] [phone] Also accepts self-pay patients.  [facility] [address] Laurell Josephs [place], [place]  [phone]   [facility] [address], [place] [phone]   [facility] [address] [place] [phone]   Renaye Rakers [address], [place]   [phone] Only accepts [place] Access [place] patients after they have their name applied to their card.   Self-Pay (no insurance) in [facility]:  Organization         Address  Phone   Notes  Sickle Cell Patients, [facility] [address] [place], [place] [phone]   [facility] [address] [place], [place] [phone]   [facility]  [address],  [place] [phone]   [facility]/Dr. Osei-Bonsu  [address] [place] or [address], [place] [phone] Phone number for both [place] and  [place] locations is the same.  [facility]gent [facility] [address] [place] [phone]   [facility] [address] [place] or [address] [phone]   [facility] [address] [place] [phone], phone; [phone], fax Sees patients 1st and 3rd Saturday of every month.  Must not qualify for public or private insurance (i.e. Medicaid, Medicare, [place] Health Choice, Veterans' Benefits)  Household income should be no more than 200% of the poverty level The clinic cannot treat you if you are pregnant or think you are pregnant  Sexually transmitted diseases are not treated at the clinic.    Dental Care: Organization         Address  Phone  Notes  [facility] of [facility] [address] [place], [place] [phone] Accepts children up to age 65 who are enrolled in [place] or [place] Health Choice; pregnant women with a Medicaid card; and children who have applied for Medicaid or [place] Health Choice, but were declined, whose parents can pay a reduced fee at time of service.  [facility]  [address], [place] [phone] Accepts children up to age 58 who are enrolled in [place] or [place] Health Choice; pregnant women with a Medicaid card; and children who have applied for Medicaid or [place] Health Choice, but were declined, whose parents can pay a reduced fee at time of service.  [facility]  [address] [place], [place] [phone] Patients are seen by appointment only. Walk-ins are not accepted. [facility] will see patients 69 years of age and older. Monday - Tuesday (8am-5pm) Most Wednesdays (8:30-5pm) $30 per visit, cash only  [facility] Access PRO[facility]  [address], [facility] [phone] Patients are seen by appointment only. Walk-ins are not accepted.  [facility] will see patients 2 years of age and older. One Wednesday Evening (Monthly: Volunteer Based).  $30 per visit, cash only  [employer]  [phone] for adults; Children under age 14, call Graduate Pediatric Dentistry at [phone]. Children aged 75-14, please call [phone] to request a pediatric application.  Dental services are provided in all areas of dental care including fillings, crowns and bridges, complete and partial  dentures, implants, gum treatment, root canals, and extractions. Preventive care is also provided. Treatment is provided to both adults and children. Patients are selected via a lottery and there is often a waiting list.   [facility] [address], [place]  [phone] [email or web address]   [facility] [address] [place], [place] [phone], Ext. [phone] Second and Fourth Thursday of each month, opens at 6:30 AM; Clinic ends at 9 AM.  Patients are seen on a first-come first-served basis, and a limited number are seen during each clinic.   [facility]  [address] Ether Griffins [place], [place] [phone]   Eligibility Requirements You must have lived in [place], [place], or [place] counties for at least the last three months.   You cannot be eligible for state or federal sponsored [place], including [employer], [place], or [employer].   You generally cannot be eligible for healthcare insurance through your employer.    How to apply: Eligibility screenings are held every Tuesday and Wednesday afternoon from 1:00 pm until 4:00 pm. You do not need an appointment for the interview!  [facility] [address] [place], [place] [phone]   [facility] Health Department  [phone]   [facility] Health Department  [phone]   [facility] Health Department  [phone]    Behavioral Health Resources in the  Community: Intensive Outpatient Programs Organization         Address  Phone  Notes  [facility] Services [address], [place], [place] [phone]   [facility] [address] [place], [place] [phone]   [facility] [address], [place], [place]  [phone]   [facility] Mental Health [address]  [place], [place] [phone] or [phone]   Substance Abuse Resources Organization         Address  Phone  Notes  [facility] Drug Services  [phone]   [facility]  [phone]   [facility]  [phone]   Floydene Flock  [phone]   Residential & Outpatient Substance Abuse Program  [phone]   Psychological Services Organization         Address  Phone  Notes  [facility]  [phone]   [facility] Services  [phone]   [facility] Mental Health [address], [place] [phone] or [phone]    Mobile Crisis Teams Organization         Address  Phone  Notes  [facility], Mobile Crisis Care Unit  [phone]   [facility]  [address] [place], [place] [phone]   Doristine Locks [address] [place] [phone]    Self-Help/Support Groups Organization         Address  Phone             Notes  [facility] [place] - variety of support groups  336- I7437963 Call for more information  Narcotics Anonymous (NA), [facility] [address], [employer] [place]  2 meetings at this location   Statistician         Address  Phone  Notes  [facility] [address],    [place]  [phone]   [facility]  [address] [place] [address], [place], [place] [phone]   [facility] Treatment Facility [address] [place], [place] [phone] Admissions: 8am-3pm M-F  Incentives  [facility] [address]    [place], [place] [phone]   [facility] [address] [place], [place], [place] [phone]   [facility] [address]  [place], [place] [phone]   [facility] - Intensive Outpatient [address] Laurell Josephs [address], [place], [place] [phone]   [facility] (Addiction Recovery [facility] [address] [place]  [place], [place] [phone] or [phone]   [facility] ([facility]) [address] [place], [place] [phone] Accepts Medicaid  [facility] [address],  [place] [phone] Substance Abuse/Addiction Treatment   [facility] Psychiatric Organization         Address  Phone  Notes  [facility]  [phone]   Angie Fava, PhD [address] Ervin Knack [place], [place]   [phone] or [phone]   [facility]al   [address] [place], [place] [phone]   [facility] [address], [place], [place] [phone] Insurance/Medicaid/sponsorship through [facility] and Families [address]                                    [place], [place] [phone] Therapy/tele-psych/case  [facility] [address], [place] [phone]    Dr. Lolly Mustache  [phone]   [facility]  [employer] [facility] Dept. 1) [address], [place] 2) [address] [place] 3)  [address], [place] [phone]  [phone]   [facility] Child Abuse Hotline 641-727-8484 or [phone] (After Hours)

## 2015-05-28 NOTE — Telephone Encounter (Signed)
LMVM for pt to return call about appt. (went to ED last night for seizure).  Had not been taking medication per ED notes (wife).

## 2015-05-28 NOTE — ED Provider Notes (Signed)
By signing my name below, I, Linus Galas, attest that this documentation has been prepared under the direction and in the presence of [employer], DO. Electronically Signed: Linus Galas, ED Scribe. 05/28/2015. 12:46 AM.  TIME SEEN: 12:37 AM   CHIEF COMPLAINT:  Chief Complaint  Patient presents with  . Seizures   HPI:  HPI Comments: Calvin [name] is a 35 y.o. male with a h/o seizures who presents to the Emergency Department via EMS with a C-Collar in place complaining of episodic seizures that began PTA. As per EMS, the pt had 3 seizures within a 10-15 minutes span of time. Pt last remembers that he woke up in the bathroom talking to his wife. He reports associated mild neck pain earlier that is now gone and abrasion to left forehead. Pt states that he takes Tegretol for his seizures and has not had a seizure for the past two years. Pt reports he in compliant with his medication. Pt see [facility] for his seizures. He denies anticoagulation therapy. He denies any recent stress, sleep difficulties or recent drug or ETOH use. Pt denies any tongue biting, urinary incontinence, fevers, chills, N/V/D dysuria, hematuria, or any numbness, tingling or weakenss in any of the extremities. Pt reports his last Tetanus vaccination more than 5 years ago.  ROS: See HPI Constitutional: no fever  Eyes: no drainage  ENT: no runny nose   Cardiovascular:  no chest pain  Resp: no SOB  GI: no vomiting GU: no dysuria Integumentary: no rash  Allergy: no hives  Musculoskeletal: no leg swelling  Neurological: no slurred speech ROS otherwise negative  PAST MEDICAL HISTORY/PAST SURGICAL HISTORY:  Past Medical History  Diagnosis Date  . Seizures (HCC)   . Hypertension     MEDICATIONS:  Prior to Admission medications   Medication Sig Start Date End Date Taking? Authorizing Provider  carbamazepine (CARBATROL) 200 MG 12 hr capsule Take 1 capsule (200 mg total) by mouth 2 (two) times daily.  Along with  total  twice daily 08/21/14   Nilda Riggs, NP  carbamazepine (CARBATROL) 300 MG 12 hr capsule Take 1 capsule (300 mg total) by mouth 2 (two) times daily. Along with  total  twice daily 08/21/14   Nilda Riggs, NP    ALLERGIES:  No Known Allergies  SOCIAL HISTORY:  Social History  Substance Use Topics  . Smoking status: Never Smoker   . Smokeless tobacco: Never Used  . Alcohol Use: No    FAMILY HISTORY: Family History  Problem Relation Age of Onset  . Healthy Mother     EXAM: BP 129/78 mmHg  Pulse 92  Temp(Src) 98.9 F (37.2 C) (Oral)  Resp 21  Ht  (1.88 m)  Wt 185 lb (83.915 kg)  BMI 23.74 kg/m2  SpO2 100% CONSTITUTIONAL: Alert and oriented x 3 and responds appropriately to questions. Well-appearing; well-nourished; GCS 15 HEAD: Normocephalic; left forehead hhemtoma with associated abrasion, no palpable skull fracture EYES: Conjunctivae clear, PERRL, EOMI ENT: normal nose; no rhinorrhea; moist mucous membranes; pharynx without lesions noted; no dental injury; no septal hematoma, no hemotympanum, no draining fluid from ears, no bruising behind the ears NECK: Supple, no meningismus, no LAD; no midline spinal tenderness, step-off or deformity; in cervical collar CARD: RRR; S1 and S2 appreciated; no murmurs, no clicks, no rubs, no gallops RESP: Normal chest excursion without splinting or tachypnea; breath sounds clear and equal bilaterally; no wheezes, no rhonchi, no rales; no hypoxia or respiratory distress CHEST:  chest wall stable, no crepitus or ecchymosis or deformity, nontender to palpation ABD/GI: Normal bowel sounds; non-distended; soft, non-tender, no rebound, no guarding PELVIS:  stable, nontender to palpation BACK:  The back appears normal and is non-tender to palpation, there is no CVA tenderness; no midline spinal tenderness, step-off or deformity EXT: Normal ROM in all joints; non-tender to palpation; no edema;  normal capillary refill; no cyanosis, no bony tenderness or bony deformity of patient's extremities, no joint effusion, no ecchymosis or lacerations   Chronically larger LLE compared to the RLE SKIN: Normal color for age and race; warm NEURO: Moves all extremities equally, sensation to light touch intact diffusely, cranial nerves II through XII intact; 5/5 strength in all four extremities  PSYCH: The patient's mood and manner are appropriate. Grooming and personal hygiene are appropriate.  MEDICAL DECISION MAKING: Patient here with 3 seizures that occurred back to back. His wife reports that each episode lasted approximately 2-3 minutes. He was postictal after these episodes and never came back to his baseline before he would have another seizure within the next minute or 2. Total episode lasted approximately 10-15 minutes. Now back to baseline without complaints. He does have a hematoma and abrasion to his forehead. We'll update his tetanus vaccination. Was complaining earlier of neck pain but this is now gone. Cervical collar removed as his C-spine was cleared clinically. He is neurologically intact. Reports compliance with his medication. No recent infectious symptoms. No drug or alcohol use.  A Tegretol level has been ordered in triage. We'll also obtain basic labs, urine. Will continue to closely monitor patient. Anticipate discussion with neurology for recommendations.  ED PROGRESS: 1:45 AM  Patient's wife is now at bedside. Pt's Tegretol level is less than 2.They now admit the patient has not been taking his Tegretol for 2 years. Discussed with patient that it is no longer safe for him to drive, operate machinery or perform any other activity that may be dangerous to himself or others if he were to have another seizure until he has been cleared by a neurologist. Maryclare Labrador discuss recommendations with neurology on call and pharmacist for dosing antiepileptics.   Patient does have a mildly elevated  creatinine at 1.61 compared to baseline. We'll give a liter of IV fluids. Have advised him to increase his fluid intake at home. Otherwise labs unremarkable. Urine pending.   2:13 AM  DW Dr. Roseanne Reno Neurology Hospitalist who advised to put pt on 200 mg of Tegretol BID and to f[facility] with [facility]. Discussed with pharmacy who agrees that this is an appropriate loading dose. Given 200 mg in the emergency department.      Patient's urine shows hematuria and proteinuria. Have advised him to follow-up with PCP for this. No sign of infection. Urine drug screen is negative. He has been hemodynamically stable and neurologically intact. No further seizure-like activity. I feel he is safe to be discharged with his wife. Discussed return precautions. Patient and wife verbalize understanding and are comfortable with this plan. [facility] for follow-up.    I personally performed the services described in this documentation, which was scribed in my presence. The recorded information has been reviewed and is accurate.     Layla Maw Dayvian Blixt, DO 05/28/15 [phone]

## 2015-05-28 NOTE — ED Provider Notes (Signed)
CSN: [id number]     Arrival date & time 05/28/15  1608 History   First MD Initiated Contact with Patient 05/28/15 1610     Chief Complaint  Patient presents with  . Seizures     (Consider location/radiation/quality/duration/timing/severity/associated sxs/prior Treatment) HPI   21 y M w PMH seizures, htn, who presents today with a seizure.  Patient had previously been off of AED for 2 years.  Last night he had 3 B2B seizures and presented to the ED.  He came back to baseline yesterday evening and was ultimately started back on tegretol after discussions with neuorology and sent home.  Patient was at an [facility] appointment with his wife today when he again had a seizure that was generalized and lasted for approx 10 min.  Patient is almost back to baseline but still a little bit sleepy.  Past Medical History  Diagnosis Date  . Seizures (HCC)   . Hypertension    Past Surgical History  Procedure Laterality Date  . Tonsillectomy     Family History  Problem Relation Age of Onset  . Healthy Mother    Social History  Substance Use Topics  . Smoking status: Never Smoker   . Smokeless tobacco: Never Used  . Alcohol Use: No    Review of Systems  Constitutional: Negative for fever and chills.  Eyes: Negative for redness.  Respiratory: Negative for cough and shortness of breath.   Cardiovascular: Negative for chest pain.  Gastrointestinal: Negative for nausea, vomiting, abdominal pain and diarrhea.  Genitourinary: Negative for dysuria.  Skin: Negative for rash.  Neurological: Positive for seizures. Negative for headaches.  All other systems reviewed and are negative.     Allergies  Review of patient's allergies indicates no known allergies.  Home Medications   Prior to Admission medications   Medication Sig Start Date End Date Taking? Authorizing Provider  carbamazepine (TEGRETOL) 200 MG tablet Take 1 tablet (200 mg total) by mouth 2 (two) times daily. 05/28/15  Yes Kristen N Ward,  DO  omega-3 acid ethyl esters (LOVAZA) 1 g capsule Take 1 g by mouth 2 (two) times daily.   Yes Historical Provider, MD  Lacosamide 100 MG TABS Take 1 tablet (100 mg total) by mouth 2 (two) times daily. 05/28/15 06/28/15  Silas Flood, MD   BP 122/68 mmHg  Pulse 82  Temp(Src) 99.3 F (37.4 C) (Oral)  Resp 16  SpO2 99% Physical Exam  Constitutional: He is oriented to person, place, and time. No distress.  HENT:  Head: Normocephalic and atraumatic.  Eyes: EOM are normal. Pupils are equal, round, and reactive to light.  Neck: Normal range of motion. Neck supple.  Cardiovascular: Normal rate.   Pulmonary/Chest: Effort normal. No respiratory distress.  Abdominal: Soft. There is no tenderness.  Musculoskeletal: Normal range of motion.  Neurological: He is alert and oriented to person, place, and time. He has normal strength. No cranial nerve deficit or sensory deficit. Coordination and gait normal. GCS eye subscore is 4. GCS verbal subscore is 5. GCS motor subscore is 6.  Skin: No rash noted. He is not diaphoretic.  Psychiatric: He has a normal mood and affect.    ED Course  Procedures (including critical care time) Labs Review Labs Reviewed  BASIC METABOLIC PANEL - Abnormal; Notable for the following:    CO2 20 (*)    Glucose, Bld 107 (*)    Creatinine, Ser 1.73 (*)    GFR calc non Af Amer 50 (*)  GFR calc Af Amer 58 (*)    Anion gap 16 (*)    All other components within normal limits  CARBAMAZEPINE LEVEL, TOTAL - Abnormal; Notable for the following:    Carbamazepine Lvl 2.8 (*)    All other components within normal limits  MAGNESIUM  CBG MONITORING, ED    Imaging Review Ct Head Wo Contrast  05/28/2015  CLINICAL DATA:  Seizure yesterday and today.  Fall. EXAM: CT HEAD WITHOUT CONTRAST TECHNIQUE: Contiguous axial images were obtained from the base of the skull through the vertex without intravenous contrast. COMPARISON:  12/31/2007 and 01/01/2008 FINDINGS: The brainstem,  cerebellum, cerebral peduncles, thalami, basal ganglia, basilar cisterns, and ventricular system appear within normal limits. No intracranial hemorrhage, mass lesion, or acute CVA. Left forehead scalp hematoma observed. No underlying calvarial fracture or acute bony findings. Visualized paranasal sinuses appear clear. IMPRESSION: 1. No acute intracranial findings. 2. Left forehead scalp hematoma without underlying calvarial abnormality. Electronically Signed   By: Gaylyn Rong M.D.   On: 05/28/2015 18:54   I have personally reviewed and evaluated these images and lab results as part of my medical decision-making.   EKG Interpretation None      MDM   Final diagnoses:  Breakthrough seizure (HCC)  Creatinine elevation    57 y M w PMH seizures, htn, who presents today with a seizure.  Exam as above.  Patient has come back to baseline, is alert, normal neuro exam.  Ct head obtained given fall yesterday and multiple seizures.  Will also obtain bmp and tegretol level.  Ct head neg.  Bmp with creatinine 1.73 which is slightly worse than yesterday when it was 1.61.  Patient has no sx of volume depletion but will give 1L NS.  The rest of his electrolytes look good.  I have discussed pt with oncall neuro who recommend loading with vimpat and starting on vimpat.  Will give rx for vimpat, neuro follow up within the next two weeks.  Have informed patient of creatinine bump and will have him f/u with primary care within the next couple of days for re-check.  I have discussed the results, Dx and Tx plan with the patient. They expressed understanding and agree with the plan and were told to return to ED with any worsening of condition or concern.    Disposition: Discharge  Condition: Good  Discharge Medication List as of 05/28/2015  8:26 PM    START taking these medications   Details  Lacosamide 100 MG TABS Take 1 tablet (100 mg total) by mouth 2 (two) times daily., Starting 05/28/2015, Until Sun  06/28/15, Print        Follow Up: [facility] [address] [place] [address] [phone]  Please establish a primary care doctor to have your creatinine re-checked next week.  Please also call your neurologist and schedule a follow up appointment within the next two weeks.   Pt seen in conjunction with Dr. Marga Hoots, MD 05/29/15 0004  Arby Barrette, MD 06/01/15 [phone]

## 2015-06-02 NOTE — Telephone Encounter (Signed)
Has appt. tomorrow

## 2015-06-03 ENCOUNTER — Encounter: Payer: Self-pay | Admitting: Nurse Practitioner

## 2015-06-03 ENCOUNTER — Ambulatory Visit (INDEPENDENT_AMBULATORY_CARE_PROVIDER_SITE_OTHER): Payer: BLUE CROSS/BLUE SHIELD | Admitting: Nurse Practitioner

## 2015-06-03 ENCOUNTER — Telehealth: Payer: Self-pay | Admitting: *Deleted

## 2015-06-03 VITALS — BP 138/87 | HR 97 | Ht 74.5 in | Wt 251.6 lb

## 2015-06-03 DIAGNOSIS — G40909 Epilepsy, unspecified, not intractable, without status epilepticus: Secondary | ICD-10-CM

## 2015-06-03 DIAGNOSIS — Z5181 Encounter for therapeutic drug level monitoring: Secondary | ICD-10-CM

## 2015-06-03 DIAGNOSIS — G40209 Localization-related (focal) (partial) symptomatic epilepsy and epileptic syndromes with complex partial seizures, not intractable, without status epilepticus: Secondary | ICD-10-CM

## 2015-06-03 NOTE — Telephone Encounter (Signed)
Form,[employer] assistant, radio received from [employer] sent to [place] [facility] [place] 06/03/15.

## 2015-06-03 NOTE — Progress Notes (Signed)
[facility]  PATIENT: Calvin [name] DOB: 1980-12-18   REASON FOR VISIT: Follow-up for seizure disorder, partial epilepsy HISTORY FROM: Patient and sister [name]    HISTORY OF PRESENT ILLNESS:Calvin [name], 35 year-old male returns for followup. He was last seen in this office 08/20/14.  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 He presented to the emergency room where he admitted to not taking his Tegretol and his level was less than 2. He was given a prescription and returned  the next day with another seizure at which time his carbamazepine level was 2.8 and he was started on Vimpat. 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 He needs labs to monitor adherence to his drug regimen. He is aware he cannot drive for 6 months .  HISTORY: Partial complex seizure disorder with last seizure occurring February 2013. Seizures in the past are due to noncompliance with his medications with seizure activity occurring. He was on Keppra and Depakote however he has been switched to carbamazepine by the emergency room in 2010. He does not have PCP.    REVIEW OF SYSTEMS: Full 14 system review of systems performed and notable only for those listed, all others are neg:  Constitutional: neg  Cardiovascular: neg Ear/Nose/Throat: neg  Skin: neg Eyes: neg Respiratory: neg Gastroitestinal: neg  Hematology/Lymphatic: neg  Endocrine: neg Musculoskeletal:neg Allergy/Immunology: neg Neurological: Seizure disorder Psychiatric: neg Sleep : neg   ALLERGIES: No Known Allergies  HOME MEDICATIONS: Outpatient Prescriptions Prior to Visit  Medication Sig Dispense Refill  . carbamazepine (TEGRETOL) 200 MG tablet Take 1 tablet (200 mg total) by mouth 2 (two) times daily. 60 tablet 2  . Lacosamide 100 MG TABS Take 1 tablet (100 mg total) by mouth 2  (two) times daily. 60 tablet 0  . omega-3 acid ethyl esters (LOVAZA) 1 g capsule Take 1 g by mouth 2 (two) times daily.     No facility-administered medications prior to visit.    PAST MEDICAL HISTORY: Past Medical History  Diagnosis Date  . Seizures (HCC)   . Hypertension     PAST SURGICAL HISTORY: Past Surgical History  Procedure Laterality Date  . Tonsillectomy      FAMILY HISTORY: Family History  Problem Relation Age of Onset  . Healthy Mother     SOCIAL HISTORY: Social History   Social History  . Marital Status: Married    Spouse Name: [name]  . Number of Children: 0  . Years of Education: BA   Occupational History  .      [employer]   Social History Main Topics  . Smoking status: Never Smoker   . Smokeless tobacco: Never Used  . Alcohol Use: No  . Drug Use: No  . Sexual Activity: Yes   Other Topics Concern  . Not on file   Social History Narrative   his is a 35 year old gentleman. He has never used caffeine, tobacco, alcohol or drugs. He has been married for 3 yrs and has no children. He lives at home with his wife.     PHYSICAL EXAM  Filed Vitals:   06/03/15 1103  BP: 138/87  Pulse: 97  Height: 6' 2.5" (1.892 m)  Weight: 251 lb 9.6 oz (114.125 kg)   Body mass index is 31.88 kg/(m^2). Generalized: Well developed, obese male in no acute distress   Neurological  examination   Mentation: Alert oriented to time, place, history taking. Follows all commands speech and language fluent  Cranial nerve II-XII: Pupils were equal round reactive to light extraocular movements were full, visual field were full on confrontational test. Facial sensation and strength were normal. hearing was intact to finger rubbing bilaterally. Uvula tongue midline. head turning and shoulder shrug were normal and symmetric.Tongue protrusion into cheek strength was normal. Motor: normal bulk and tone, full strength in the BUE, BLE, fine finger movements normal, no  pronator drift. No focal weakness Coordination: finger-nose-finger, heel-to-shin bilaterally, no dysmetria Reflexes: 1+ upper lower and symmetric Gait and Station: Rising up from seated position without assistance, normal stance, moderate stride, good arm swing, smooth turning, able to perform tiptoe, and heel walking without difficulty. Tandem gait is steady    DIAGNOSTIC DATA (LABS, IMAGING, TESTING) - I reviewed patient records, labs, notes, testing and imaging myself where available.  Lab Results  Component Value Date   WBC 6.7 05/27/2015   HGB 15.9 05/27/2015   HCT 47.2 05/27/2015   MCV 85.7 05/27/2015   PLT 202 05/27/2015      Component Value Date/Time   NA 142 05/28/2015 1646   NA 140 08/20/2014 0832   K 4.3 05/28/2015 1646   CL 106 05/28/2015 1646   CO2 20* 05/28/2015 1646   GLUCOSE 107* 05/28/2015 1646   GLUCOSE 99 08/20/2014 0832   BUN 15 05/28/2015 1646   BUN 19 08/20/2014 0832   CREATININE 1.73* 05/28/2015 1646   CALCIUM 9.3 05/28/2015 1646   PROT 7.1 05/27/2015 2350   PROT 6.7 08/20/2014 0832   ALBUMIN 4.2 05/27/2015 2350   ALBUMIN 4.2 08/20/2014 0832   AST 36 05/27/2015 2350   ALT 35 05/27/2015 2350   ALKPHOS 51 05/27/2015 2350   BILITOT 0.4 05/27/2015 2350   BILITOT 0.2 08/20/2014 0832   GFRNONAA 50* 05/28/2015 1646   GFRAA 58* 05/28/2015 1646    ASSESSMENT AND PLAN  35 y.o. year old male  has a past medical history of Seizures (HCC) and Hypertension. here to follow-up patient had an ER visit on 05/27/2015 for seizure that occurred at home witnessed by his wife. Carbamazepine level less than 2. He returned the next day for another seizure carbamazepine level 2.8 and Vimpat was started in addition to carbamazepine. He has a history of noncompliance in the past. The patient is a current patient of Dr. Pearlean Brownie  who is out of the office today . This note is sent to the work in doctor.     Will obtain labs today, CBC, CMP CBZ and Lacosamine level Continue  Tegretol 200 mg twice daily Continue Vimpat 100 twice daily No driving for 6 months Will refill meds once levels are back Follow-up in 6 months Please remember, common seizure triggers are: Sleep deprivation, dehydration, overheating, stress, hypoglycemia or skipping meals, certain medications or excessive alcohol use, especially stopping alcohol abruptly if you have had heavy alcohol use before (aka alcohol withdrawal seizure). If you have a prolonged seizure over 2-5 minutes or back to back seizures, call or have someone call 911 or take you to the nearest emergency room. You cannot drive a car or operate any other machinery or vehicle within 6 months of a seizure. Please do not swim alone or take a tub bath for safety. Do not cook with large quantities of boiling water or oil for safety. Take your medicine for seizure prevention regularly and do not skip doses or stop medication abruptly and  tone are told to do so by your healthcare provider. Try to get a refill on your antiepileptic medication ahead of time, so you are not at risk of running out. If you run out of the seizure medication and do not have a refill at hand she may run into medication withdrawal seizures. Avoid taking Wellbutrin, narcotic pain medications and tramadol, as they can lower seizure threshold. Vst time 25 min Nilda Riggs, [facility], [facility], APRN  [facility] [address] [place], [place] [address] [phone]

## 2015-06-03 NOTE — Patient Instructions (Addendum)
Will obtain labs today Continue Tegretol 200 mg twice daily Continue Vimpat 100 twice daily No driving for 6 months Follow-up in 6 months Please remember, common seizure triggers are: Sleep deprivation, dehydration, overheating, stress, hypoglycemia or skipping meals, certain medications or excessive alcohol use, especially stopping alcohol abruptly if you have had heavy alcohol use before (aka alcohol withdrawal seizure). If you have a prolonged seizure over 2-5 minutes or back to back seizures, call or have someone call 911 or take you to the nearest emergency room. You cannot drive a car or operate any other machinery or vehicle within 6 months of a seizure. Please do not swim alone or take a tub bath for safety. Do not cook with large quantities of boiling water or oil for safety. Take your medicine for seizure prevention regularly and do not skip doses or stop medication abruptly and tone are told to do so by your healthcare provider. Try to get a refill on your antiepileptic medication ahead of time, so you are not at risk of running out. If you run out of the seizure medication and do not have a refill at hand she may run into medication withdrawal seizures. Avoid taking Wellbutrin, narcotic pain medications and tramadol, as they can lower seizure threshold.

## 2015-06-03 NOTE — Progress Notes (Signed)
I have reviewed and agree with the above plan. Richard A. Epimenio Foot, MD, PhD Certified in Neurology, Clinical Neurophysiology, Sleep Medicine, Pain Medicine and Neuroimaging

## 2015-06-04 ENCOUNTER — Telehealth: Payer: Self-pay | Admitting: Nurse Practitioner

## 2015-06-04 DIAGNOSIS — Z0289 Encounter for other administrative examinations: Secondary | ICD-10-CM

## 2015-06-04 NOTE — Telephone Encounter (Signed)
Sister Marcelino Duster called to request refills of carbamazepine (TEGRETOL) 200 MG tablet and Lacosamide 100 MG TABS, [facility].

## 2015-06-04 NOTE — Telephone Encounter (Signed)
To MR. 

## 2015-06-04 NOTE — Telephone Encounter (Signed)
Form completed, sent to CM/NP for review and signature.

## 2015-06-04 NOTE — Telephone Encounter (Signed)
Form, [place] received,completed by Fernande Bras faxed 06/04/15.

## 2015-06-04 NOTE — Telephone Encounter (Signed)
Reviewed and signed

## 2015-06-05 LAB — COMPREHENSIVE METABOLIC PANEL
ALBUMIN: 4.5 g/dL (ref 3.5–5.5)
ALT: 48 IU/L — ABNORMAL HIGH (ref 0–44)
AST: 28 IU/L (ref 0–40)
Albumin/Globulin Ratio: 1.7 (ref 1.1–2.5)
Alkaline Phosphatase: 62 IU/L (ref 39–117)
BUN / CREAT RATIO: 14 (ref 8–19)
BUN: 15 mg/dL (ref 6–20)
CO2: 26 mmol/L (ref 18–29)
CREATININE: 1.1 mg/dL (ref 0.76–1.27)
Calcium: 9.4 mg/dL (ref 8.7–10.2)
Chloride: 100 mmol/L (ref 96–106)
GFR calc non Af Amer: 87 mL/min/{1.73_m2} (ref >59)
GFR, EST AFRICAN AMERICAN: 101 mL/min/{1.73_m2} (ref >59)
GLOBULIN, TOTAL: 2.7 g/dL (ref 1.5–4.5)
GLUCOSE: 100 mg/dL — AB (ref 65–99)
Potassium: 4.8 mmol/L (ref 3.5–5.2)
Sodium: 142 mmol/L (ref 134–144)
TOTAL PROTEIN: 7.2 g/dL (ref 6.0–8.5)

## 2015-06-05 LAB — CBC WITH DIFFERENTIAL/PLATELET
Basophils Absolute: 0 10*3/uL (ref 0.0–0.2)
Basos: 1 %
EOS (ABSOLUTE): 0 10*3/uL (ref 0.0–0.4)
EOS: 1 %
Hematocrit: 44.1 % (ref 37.5–51.0)
Hemoglobin: 14.9 g/dL (ref 12.6–17.7)
IMMATURE GRANS (ABS): 0 10*3/uL (ref 0.0–0.1)
IMMATURE GRANULOCYTES: 1 %
LYMPHS ABS: 2.1 10*3/uL (ref 0.7–3.1)
Lymphs: 47 %
MCH: 28.5 pg (ref 26.6–33.0)
MCHC: 33.8 g/dL (ref 31.5–35.7)
MCV: 85 fL (ref 79–97)
MONOCYTES: 7 %
Monocytes Absolute: 0.3 10*3/uL (ref 0.1–0.9)
NEUTROS ABS: 1.9 10*3/uL (ref 1.4–7.0)
Neutrophils: 43 %
PLATELETS: 215 10*3/uL (ref 150–379)
RBC: 5.22 x10E6/uL (ref 4.14–5.80)
RDW: 13.7 % (ref 12.3–15.4)
WBC: 4.3 10*3/uL (ref 3.4–10.8)

## 2015-06-05 LAB — LACOSAMIDE: LACOSAMIDE: 3.2 ug/mL — AB (ref 5.0–10.0)

## 2015-06-05 LAB — CARBAMAZEPINE LEVEL, TOTAL: CARBAMAZEPINE LVL: 6.8 ug/mL (ref 4.0–12.0)

## 2015-06-05 MED ORDER — CARBAMAZEPINE 200 MG PO TABS: 200.0000 mg | ORAL_TABLET | Freq: Two times a day (BID) | ORAL | Status: DC

## 2015-06-05 MED ORDER — LACOSAMIDE 100 MG PO TABS: 100.0000 mg | ORAL_TABLET | Freq: Two times a day (BID) | ORAL | Status: DC

## 2015-06-08 ENCOUNTER — Telehealth: Payer: Self-pay | Admitting: *Deleted

## 2015-06-08 NOTE — Telephone Encounter (Signed)
I spoke to pt and relayed that his lab results were good.  He verbalized understanding.

## 2015-06-08 NOTE — Telephone Encounter (Signed)
-----   Message from Nilda Riggs, NP sent at 06/08/2015 11:31 AM EST ----- Labs ok please call the patient

## 2015-06-17 ENCOUNTER — Telehealth: Payer: Self-pay | Admitting: Nurse Practitioner

## 2015-06-17 ENCOUNTER — Other Ambulatory Visit: Payer: Self-pay | Admitting: Nurse Practitioner

## 2015-06-17 MED ORDER — LACOSAMIDE 100 MG PO TABS: 100.0000 mg | ORAL_TABLET | Freq: Two times a day (BID) | ORAL | Status: DC

## 2015-06-17 NOTE — Telephone Encounter (Signed)
Called and spoke to patient and made him aware we did not have samples anymore. Patient understood. Relayed to patient when patient assistance form was signed I would call him.

## 2015-06-17 NOTE — Telephone Encounter (Signed)
Called patient and spoke to relayed for patient assistance he needed to have proof of income. I relayed to patient form has been signed and he needed to attach his proof of income to patient assistance form and send in. Patient understood and he would submit himself.

## 2015-06-22 ENCOUNTER — Telehealth: Payer: Self-pay | Admitting: Nurse Practitioner

## 2015-06-22 NOTE — Telephone Encounter (Signed)
This was done in Feb for 6 months. It can only be done for 6 months it is controlled.

## 2015-06-22 NOTE — Telephone Encounter (Signed)
Sister Rosie Fate [phone] called to request refill of Lacosamide 100 MG TABS, request continuous refills so they don't have to call each month.

## 2015-06-22 NOTE — Telephone Encounter (Signed)
It appears this Rx was auth for 6 month supply on 02/08.  Will send message to clinic to follow up.

## 2015-06-23 NOTE — Telephone Encounter (Signed)
I called pharmacy and they picked up prescription for lacosamide (vimpat) yesterday.  There are 4 refills remaining.  I spoke to sister, Marcelino Duster McDoom and relayed this.  She will touch base with the pharmacy later to day.  I told her to call back if problems.

## 2015-07-18 ENCOUNTER — Encounter (HOSPITAL_COMMUNITY): Payer: Self-pay | Admitting: Emergency Medicine

## 2015-07-18 ENCOUNTER — Inpatient Hospital Stay (HOSPITAL_COMMUNITY)
Admission: EM | Admit: 2015-07-18 | Discharge: 2015-07-27 | DRG: 872 | Disposition: A | Discharge status: Home / Self Care | Payer: BLUE CROSS/BLUE SHIELD | Attending: Internal Medicine | Admitting: Internal Medicine

## 2015-07-18 DIAGNOSIS — L03116 Cellulitis of left lower limb: Secondary | ICD-10-CM | POA: Diagnosis present

## 2015-07-18 DIAGNOSIS — A419 Sepsis, unspecified organism: Secondary | ICD-10-CM | POA: Diagnosis present

## 2015-07-18 DIAGNOSIS — L0390 Cellulitis, unspecified: Secondary | ICD-10-CM | POA: Diagnosis not present

## 2015-07-18 DIAGNOSIS — R0602 Shortness of breath: Secondary | ICD-10-CM

## 2015-07-18 DIAGNOSIS — R05 Cough: Secondary | ICD-10-CM | POA: Diagnosis present

## 2015-07-18 DIAGNOSIS — G40909 Epilepsy, unspecified, not intractable, without status epilepticus: Secondary | ICD-10-CM | POA: Diagnosis present

## 2015-07-18 DIAGNOSIS — Z86718 Personal history of other venous thrombosis and embolism: Secondary | ICD-10-CM | POA: Diagnosis present

## 2015-07-18 DIAGNOSIS — R059 Cough, unspecified: Secondary | ICD-10-CM | POA: Diagnosis present

## 2015-07-18 DIAGNOSIS — G40209 Localization-related (focal) (partial) symptomatic epilepsy and epileptic syndromes with complex partial seizures, not intractable, without status epilepticus: Secondary | ICD-10-CM | POA: Diagnosis not present

## 2015-07-18 DIAGNOSIS — R197 Diarrhea, unspecified: Secondary | ICD-10-CM | POA: Diagnosis present

## 2015-07-18 LAB — COMPREHENSIVE METABOLIC PANEL
ALBUMIN: 4.4 g/dL (ref 3.5–5.0)
ALT: 36 U/L (ref 17–63)
ANION GAP: 15 (ref 5–15)
AST: 38 U/L (ref 15–41)
Alkaline Phosphatase: 58 U/L (ref 38–126)
BUN: 13 mg/dL (ref 6–20)
CHLORIDE: 101 mmol/L (ref 101–111)
CO2: 24 mmol/L (ref 22–32)
Calcium: 9 mg/dL (ref 8.9–10.3)
Creatinine, Ser: 1.29 mg/dL — ABNORMAL HIGH (ref 0.61–1.24)
GFR calc Af Amer: >60 mL/min (ref >60)
GFR calc non Af Amer: >60 mL/min (ref >60)
GLUCOSE: 108 mg/dL — AB (ref 65–99)
POTASSIUM: 4.2 mmol/L (ref 3.5–5.1)
SODIUM: 140 mmol/L (ref 135–145)
Total Bilirubin: 0.5 mg/dL (ref 0.3–1.2)
Total Protein: 7.7 g/dL (ref 6.5–8.1)

## 2015-07-18 LAB — CBC
HEMATOCRIT: 44.1 % (ref 39.0–52.0)
HEMOGLOBIN: 14.7 g/dL (ref 13.0–17.0)
MCH: 27.7 pg (ref 26.0–34.0)
MCHC: 33.3 g/dL (ref 30.0–36.0)
MCV: 83.2 fL (ref 78.0–100.0)
Platelets: 201 10*3/uL (ref 150–400)
RBC: 5.3 MIL/uL (ref 4.22–5.81)
RDW: 13.1 % (ref 11.5–15.5)
WBC: 15.9 10*3/uL — ABNORMAL HIGH (ref 4.0–10.5)

## 2015-07-18 LAB — I-STAT CG4 LACTIC ACID, ED
LACTIC ACID, VENOUS: 4.18 mmol/L — AB (ref 0.5–2.0)
Lactic Acid, Venous: 4.64 mmol/L (ref 0.5–2.0)

## 2015-07-18 LAB — LIPASE, BLOOD: LIPASE: 53 U/L — AB (ref 11–51)

## 2015-07-18 MED ORDER — VANCOMYCIN HCL IN DEXTROSE 1-5 GM/200ML-% IV SOLN
1000.0000 mg | Freq: Three times a day (TID) | INTRAVENOUS | Status: DC
  Administered 2015-07-19 – 2015-07-20 (×4): 1000 mg via INTRAVENOUS
  Filled 2015-07-18 (×5): qty 200

## 2015-07-18 MED ORDER — SODIUM CHLORIDE 0.9 % IV BOLUS (SEPSIS): 1000.0000 mL | Freq: Once | INTRAVENOUS | Status: DC

## 2015-07-18 MED ORDER — MORPHINE SULFATE (PF) 2 MG/ML IV SOLN
2.0000 mg | INTRAVENOUS | Status: DC | PRN
  Administered 2015-07-18: 4 mg via INTRAVENOUS
  Administered 2015-07-19 – 2015-07-20 (×2): 2 mg via INTRAVENOUS
  Filled 2015-07-18: qty 1
  Filled 2015-07-18: qty 2
  Filled 2015-07-18: qty 1

## 2015-07-18 MED ORDER — ACETAMINOPHEN 325 MG PO TABS
650.0000 mg | ORAL_TABLET | Freq: Once | ORAL | Status: DC | PRN
  Filled 2015-07-18 (×2): qty 2

## 2015-07-18 MED ORDER — VANCOMYCIN HCL IN DEXTROSE 1-5 GM/200ML-% IV SOLN
1000.0000 mg | Freq: Once | INTRAVENOUS | Status: AC
  Administered 2015-07-18: 1000 mg via INTRAVENOUS
  Filled 2015-07-18: qty 200

## 2015-07-18 MED ORDER — LACOSAMIDE 50 MG PO TABS
100.0000 mg | ORAL_TABLET | Freq: Two times a day (BID) | ORAL | Status: DC
  Administered 2015-07-19 – 2015-07-27 (×17): 100 mg via ORAL
  Filled 2015-07-18 (×17): qty 2

## 2015-07-18 MED ORDER — SODIUM CHLORIDE 0.9 % IV BOLUS (SEPSIS)
1000.0000 mL | INTRAVENOUS | Status: AC
  Administered 2015-07-18 (×3): 1000 mL via INTRAVENOUS

## 2015-07-18 MED ORDER — SODIUM CHLORIDE 0.9 % IV SOLN
INTRAVENOUS | Status: DC
  Administered 2015-07-18 – 2015-07-22 (×8): via INTRAVENOUS

## 2015-07-18 MED ORDER — ACETAMINOPHEN 650 MG RE SUPP
650.0000 mg | Freq: Once | RECTAL | Status: AC
  Administered 2015-07-18: 650 mg via RECTAL
  Filled 2015-07-18: qty 1

## 2015-07-18 MED ORDER — MORPHINE SULFATE (PF) 4 MG/ML IV SOLN
4.0000 mg | INTRAVENOUS | Status: DC | PRN
  Administered 2015-07-18: 4 mg via INTRAVENOUS
  Filled 2015-07-18: qty 1

## 2015-07-18 MED ORDER — CARBAMAZEPINE 200 MG PO TABS
200.0000 mg | ORAL_TABLET | Freq: Two times a day (BID) | ORAL | Status: DC
  Administered 2015-07-19 – 2015-07-27 (×17): 200 mg via ORAL
  Filled 2015-07-18 (×19): qty 1

## 2015-07-18 MED ORDER — ENOXAPARIN SODIUM 40 MG/0.4ML ~~LOC~~ SOLN
40.0000 mg | SUBCUTANEOUS | Status: DC
  Administered 2015-07-18 – 2015-07-26 (×9): 40 mg via SUBCUTANEOUS
  Filled 2015-07-18 (×10): qty 0.4

## 2015-07-18 MED ORDER — SODIUM CHLORIDE 0.9 % IV BOLUS (SEPSIS)
500.0000 mL | INTRAVENOUS | Status: AC
  Administered 2015-07-18: 500 mL via INTRAVENOUS

## 2015-07-18 MED ORDER — ONDANSETRON 4 MG PO TBDP
4.0000 mg | ORAL_TABLET | Freq: Once | ORAL | Status: AC | PRN
  Administered 2015-07-18: 4 mg via ORAL
  Filled 2015-07-18: qty 1

## 2015-07-18 MED ORDER — SODIUM CHLORIDE 0.9 % IV SOLN: Freq: Once | INTRAVENOUS | Status: DC

## 2015-07-18 MED ORDER — ONDANSETRON HCL 4 MG/2ML IJ SOLN
4.0000 mg | Freq: Once | INTRAMUSCULAR | Status: AC
  Administered 2015-07-18: 4 mg via INTRAVENOUS
  Filled 2015-07-18: qty 2

## 2015-07-18 MED ORDER — ACETAMINOPHEN 325 MG PO TABS
650.0000 mg | ORAL_TABLET | Freq: Four times a day (QID) | ORAL | Status: DC | PRN
  Administered 2015-07-19 – 2015-07-21 (×6): 650 mg via ORAL
  Filled 2015-07-18 (×5): qty 2

## 2015-07-18 NOTE — Progress Notes (Signed)
Pharmacy Antibiotic Follow-up Note  Calvin [name] is a 35 y.o. year-old male admitted on 07/18/2015.  The patient is currently on day 1 of Vancomycin for PNA.  Assessment/Plan: Vancomycin 1gm IV every 8 hours.  Goal trough 15-20 mcg/mL.  Will need additional Vancomycin 1gm for total 2gm loading dose Anticipate addition of Zosyn or Cefepime  Temp (24hrs), Avg:99.9 F (37.7 C), Min:99.3 F (37.4 C), Max:100.4 F (38 C)   Recent Labs Lab 07/18/15 1715  WBC 15.9*    Recent Labs Lab 07/18/15 1715  CREATININE 1.29*   Estimated Creatinine Clearance: 108.9 mL/min (by C-G formula based on Cr of 1.29).    No Known Allergies  Antimicrobials this admission: 3/11 Vancomycin >>  3/11  >>   Levels/dose changes this admission:   Microbiology results: 3/11 BCx: sent  Thank you for allowing pharmacy to be a part of this patient's care.  Otho BellowsGreen, Bessie Livingood L PharmD 07/18/2015 7:17 PM

## 2015-07-18 NOTE — ED Notes (Signed)
Patient presents for left lower leg swelling approximately 0300 this morning, N/V, headache. History of cellulitis.

## 2015-07-18 NOTE — ED Notes (Signed)
Patient actively vomiting in triage. Holding tylenol.

## 2015-07-18 NOTE — H&P (Signed)
[facility] History and Physical  Calvin [name] WGN:[id number] DOB: [birth year] DOA: 07/18/2015  Referring physician: EDP PCP: Karie Chimera, MD   Chief Complaint: Cellulitis   HPI: Calvin [name] is a 35 y.o. male with h/o cellulitis of the LLE, last occuring 6 years ago, partial seizures.  Patient presents to the ED with fever, LLE erythema, pain, swelling.  Symptoms onset earlier today after his wife delivered her first child over at [facility] hospital.  Have been progressively worsening during the day, associated vomiting just PTA to the ED.  Rigors in ED.  Review of Systems: Systems reviewed.  As above, otherwise negative  Past Medical History  Diagnosis Date  . Seizures (HCC)   . Hypertension    Past Surgical History  Procedure Laterality Date  . Tonsillectomy     Social History:  reports that he has never smoked. He has never used smokeless tobacco. He reports that he does not drink alcohol or use illicit drugs.  No Known Allergies  Family History  Problem Relation Age of Onset  . Healthy Mother      Prior to Admission medications   Medication Sig Start Date End Date Taking? Authorizing Provider  carbamazepine (TEGRETOL) 200 MG tablet Take 1 tablet (200 mg total) by mouth 2 (two) times daily. 06/05/15  Yes Nilda Riggs, NP  Lacosamide 100 MG TABS Take 1 tablet (100 mg total) by mouth 2 (two) times daily. 06/17/15 07/18/15 Yes Nilda Riggs, NP  omega-3 acid ethyl esters (LOVAZA) 1 g capsule Take 1 g by mouth 2 (two) times daily.   Yes Historical Provider, MD   Physical Exam: Filed Vitals:   07/18/15 2000 07/18/15 2030  BP: 128/73 138/49  Pulse: 119 119  Temp:    Resp: 23 23    BP 138/49 mmHg  Pulse 119  Temp(Src) 99.9 F (37.7 C) (Oral)  Resp 23  Wt 113.399 kg (250 lb)  SpO2 100%  General Appearance:    Alert, oriented, no distress, appears stated age  Head:    Normocephalic, atraumatic  Eyes:    PERRL, EOMI, sclera non-icteric         Nose:   Nares without drainage or epistaxis. Mucosa, turbinates normal  Throat:   Moist mucous membranes. Oropharynx without erythema or exudate.  Neck:   Supple. No carotid bruits.  No thyromegaly.  No lymphadenopathy.   Back:     No CVA tenderness, no spinal tenderness  Lungs:     Clear to auscultation bilaterally, without wheezes, rhonchi or rales  Chest wall:    No tenderness to palpitation  Heart:    Regular rate and rhythm without murmurs, gallops, rubs  Abdomen:     Soft, non-tender, nondistended, normal bowel sounds, no organomegaly  Genitalia:    deferred  Rectal:    deferred  Extremities:   No clubbing, cyanosis or edema.  Pulses:   2+ and symmetric all extremities  Skin:   Erythema, edema, tenderness of LLE shin and ankle, does have tenderness going up the inside of his leg as well, no sub-q air, tenderness not beyond site of erythema and induration, not out of proportion to erythema  Lymph nodes:   Cervical, supraclavicular, and axillary nodes normal  Neurologic:   CNII-XII intact. Normal strength, sensation and reflexes      throughout    Labs on Admission:  Basic Metabolic Panel:  Recent Labs Lab 07/18/15 1715  NA 140  K 4.2  CL 101  CO2 24  GLUCOSE 108*  BUN 13  CREATININE 1.29*  CALCIUM 9.0   Liver Function Tests:  Recent Labs Lab 07/18/15 1715  AST 38  ALT 36  ALKPHOS 58  BILITOT 0.5  PROT 7.7  ALBUMIN 4.4    Recent Labs Lab 07/18/15 1715  LIPASE 53*   No results for input(s): AMMONIA in the last 168 hours. CBC:  Recent Labs Lab 07/18/15 1715  WBC 15.9*  HGB 14.7  HCT 44.1  MCV 83.2  PLT 201   Cardiac Enzymes: No results for input(s): CKTOTAL, CKMB, CKMBINDEX, TROPONINI in the last 168 hours.  BNP (last 3 results) No results for input(s): PROBNP in the last 8760 hours. CBG: No results for input(s): GLUCAP in the last 168 hours.  Radiological Exams on Admission: No results found.  EKG: Independently  reviewed.  Assessment/Plan Principal Problem:   Sepsis due to cellulitis [facility](HCC) Active Problems:   Partial epilepsy with impairment of consciousness (HCC)   Cellulitis of left leg   Personal history of DVT (deep vein thrombosis)   1. LLE cellulitis causing sepsis - 1. Does not appear to be necrotizing fasciitis thankfully 2. Vanc per pharm consult 3. BCx pending 4. IVF 5. Tylenol PRN fever 6. Morphine PRN pain 7. Tele monitor for tachycardia 8. Repeat CBC in AM to trend WBC 9. Repeat lactate 2. H/o DVT - given sepsis findings doubt DVT at this time in that leg, will still obtain US to rule this out though given the history 3. History of Partial epilepsy - continue home seizure meds    Code Status: Full  Family Communication: Family at bedside Disposition Plan: Admit to inpatient   Time spent: 70 min  GARDNER, JARED M. [facility] Pager [phone]  If 7AM-7PM, please contact the day team taking care of the patient [email or web address] Password [email or web address] 07/18/2015, 9:02 PM

## 2015-07-18 NOTE — ED Notes (Signed)
Patient is unable to use restroom at this time.

## 2015-07-18 NOTE — ED Notes (Signed)
Notified EDP,James,MD., pt. i-stat CG4 Lactic acid 4.18

## 2015-07-18 NOTE — ED Notes (Signed)
Pt informed again of need for urine specimen Pt given urinal Pt also given 2 Sprites, per Dionne Bucyn Oscar Will continue to monitor

## 2015-07-18 NOTE — ED Notes (Signed)
EDP aware of I stat Lactic result.

## 2015-07-18 NOTE — ED Provider Notes (Signed)
CSN: [id number]     Arrival date & time 07/18/15  1530 History   First MD Initiated Contact with Patient 07/18/15 1831     Chief Complaint  Patient presents with  . Leg Swelling  . Emesis  . Headache      HPI   Patient presents for evaluation of fever body aches and chills. He has a redness swelling his left lower leg.  Reports history of cellulitis. Last episode was 6 years ago. Some mild chronic lymphedema of the leg. Nose 6 swelling that is red and swollen. Had fever chills headache bodyache myalgias and prostration to the day today. Presents here for evaluation after he developed emesis last few hours.  Only in the sniffing past medical history history of seizures. Takes Tegretol, and Vimpat.  Of note, his wife delivered their first child this morning.   Past Medical History  Diagnosis Date  . Seizures (HCC)   . Hypertension    Past Surgical History  Procedure Laterality Date  . Tonsillectomy     Family History  Problem Relation Age of Onset  . Healthy Mother    Social History  Substance Use Topics  . Smoking status: Never Smoker   . Smokeless tobacco: Never Used  . Alcohol Use: No    Review of Systems  Constitutional: Positive for fever, chills and fatigue. Negative for diaphoresis and appetite change.  HENT: Negative for mouth sores, sore throat and trouble swallowing.   Eyes: Negative for visual disturbance.  Respiratory: Negative for cough, chest tightness, shortness of breath and wheezing.   Cardiovascular: Negative for chest pain.  Gastrointestinal: Negative for nausea, vomiting, abdominal pain, diarrhea and abdominal distention.  Endocrine: Negative for polydipsia, polyphagia and polyuria.  Genitourinary: Negative for dysuria, frequency and hematuria.  Musculoskeletal: Positive for myalgias. Negative for gait problem.  Skin: Negative for color change, pallor and rash.       Swelling and redness and pain in his left lower leg  Neurological: Negative  for dizziness, syncope, light-headedness and headaches.  Hematological: Does not bruise/bleed easily.  Psychiatric/Behavioral: Negative for behavioral problems and confusion.      Allergies  Review of patient's allergies indicates no known allergies.  Home Medications   Prior to Admission medications   Medication Sig Start Date End Date Taking? Authorizing Provider  carbamazepine (TEGRETOL) 200 MG tablet Take 1 tablet (200 mg total) by mouth 2 (two) times daily. 06/05/15  Yes Nilda RiggsNancy Carolyn Martin, NP  Lacosamide 100 MG TABS Take 1 tablet (100 mg total) by mouth 2 (two) times daily. 06/17/15 07/18/15 Yes Nilda RiggsNancy Carolyn Martin, NP  omega-3 acid ethyl esters (LOVAZA) 1 g capsule Take 1 g by mouth 2 (two) times daily.   Yes Historical Provider, MD   BP 138/49 mmHg  Pulse 119  Temp(Src) 99.9 F (37.7 C) (Oral)  Resp 23  Wt 250 lb (113.399 kg)  SpO2 100% Physical Exam  Constitutional: He is oriented to person, place, and time. He appears well-developed and well-nourished. No distress.  Pleasant Latin American adult male. Handling her blanket was shaking and rigors. Recheck temp 102.3.  HENT:  Head: Normocephalic.  Eyes: Conjunctivae are normal. Pupils are equal, round, and reactive to light. No scleral icterus.  Neck: Normal range of motion. Neck supple. No thyromegaly present.  Cardiovascular: Normal rate and regular rhythm.  Exam reveals no gallop and no friction rub.   No murmur heard. Sinus tachycardia  Pulmonary/Chest: Effort normal and breath sounds normal. No respiratory distress. He has no  wheezes. He has no rales.  Abdominal: Soft. Bowel sounds are normal. He exhibits no distension. There is no tenderness. There is no rebound.  Musculoskeletal: Normal range of motion.  Neurological: He is alert and oriented to person, place, and time.  Skin: Skin is warm and dry. No rash noted.     Psychiatric: He has a normal mood and affect. His behavior is normal.    ED Course    Procedures (including critical care time) Labs Review Labs Reviewed  LIPASE, BLOOD - Abnormal; Notable for the following:    Lipase 53 (*)    All other components within normal limits  COMPREHENSIVE METABOLIC PANEL - Abnormal; Notable for the following:    Glucose, Bld 108 (*)    Creatinine, Ser 1.29 (*)    All other components within normal limits  CBC - Abnormal; Notable for the following:    WBC 15.9 (*)    All other components within normal limits  I-STAT CG4 LACTIC ACID, ED - Abnormal; Notable for the following:    Lactic Acid, Venous 4.64 (*)    All other components within normal limits  CULTURE, BLOOD (ROUTINE X 2)  CULTURE, BLOOD (ROUTINE X 2)  URINALYSIS, ROUTINE W REFLEX MICROSCOPIC (NOT AT [facility])    Imaging Review No results found. I have personally reviewed and evaluated these images and lab results as part of my medical decision-making.   EKG Interpretation None      MDM   Final diagnoses:  Cellulitis of left lower extremity  Sepsis, due to unspecified organism (HCC)    Recheck temperature 102.3. Given Tylenol. Elevated lactate. Given 30 mL/kg fluid bolus. Has not been hypotensive. Blood cultures obtained. Given IV vancomycin.    Rolland Porter, MD 07/18/15 2049

## 2015-07-18 NOTE — Progress Notes (Signed)
eLink Physician-Brief Progress Note Patient Name: Calvin [name] DOB: 1980-11-25 MRN: [id number]   Date of Service  07/18/2015  HPI/Events of Note  Request by eICU nurse following sepsis bundle to order lactic acid level for AM.  eICU Interventions  Will order a lactic acid level for the AM.     Intervention Category Minor Interventions: Clinical assessment - ordering diagnostic tests  Lenell AntuSommer,Oluwanifemi Susman Eugene 07/18/2015, 10:23 PM

## 2015-07-19 ENCOUNTER — Encounter (HOSPITAL_COMMUNITY): Payer: BLUE CROSS/BLUE SHIELD

## 2015-07-19 LAB — BASIC METABOLIC PANEL
ANION GAP: 11 (ref 5–15)
BUN: 12 mg/dL (ref 6–20)
CALCIUM: 7.9 mg/dL — AB (ref 8.9–10.3)
CO2: 24 mmol/L (ref 22–32)
Chloride: 105 mmol/L (ref 101–111)
Creatinine, Ser: 1.28 mg/dL — ABNORMAL HIGH (ref 0.61–1.24)
Glucose, Bld: 109 mg/dL — ABNORMAL HIGH (ref 65–99)
Potassium: 3.6 mmol/L (ref 3.5–5.1)
SODIUM: 140 mmol/L (ref 135–145)

## 2015-07-19 LAB — CBC
HCT: 39 % (ref 39.0–52.0)
Hemoglobin: 12.4 g/dL — ABNORMAL LOW (ref 13.0–17.0)
MCH: 28 pg (ref 26.0–34.0)
MCHC: 31.8 g/dL (ref 30.0–36.0)
MCV: 88 fL (ref 78.0–100.0)
PLATELETS: 166 10*3/uL (ref 150–400)
RBC: 4.43 MIL/uL (ref 4.22–5.81)
RDW: 13.5 % (ref 11.5–15.5)
WBC: 11 10*3/uL — AB (ref 4.0–10.5)

## 2015-07-19 LAB — URINALYSIS, ROUTINE W REFLEX MICROSCOPIC
BILIRUBIN URINE: NEGATIVE
GLUCOSE, UA: NEGATIVE mg/dL
HGB URINE DIPSTICK: NEGATIVE
KETONES UR: NEGATIVE mg/dL
Leukocytes, UA: NEGATIVE
Nitrite: NEGATIVE
PROTEIN: NEGATIVE mg/dL
Specific Gravity, Urine: 1.014 (ref 1.005–1.030)
pH: 7.5 (ref 5.0–8.0)

## 2015-07-19 LAB — LACTIC ACID, PLASMA: Lactic Acid, Venous: 3 mmol/L (ref 0.5–2.0)

## 2015-07-19 MED ORDER — LIP MEDEX EX OINT
TOPICAL_OINTMENT | CUTANEOUS | Status: DC | PRN
  Filled 2015-07-19: qty 7

## 2015-07-19 NOTE — Progress Notes (Signed)
This morning, patient's temp 103.3, resp 24, BP 110/43. Tylenol given. On call made aware.

## 2015-07-19 NOTE — Progress Notes (Signed)
[facility]                                                                              Patient Demographics  Calvin [name], is a 35 y.o. male, DOB - [birth year], ZOX:[id number]  Admit date - 07/18/2015   Admitting Physician Hillary Bow, DO  Outpatient Primary MD for the patient is Karie Chimera, MD  LOS - 1   Chief Complaint  Patient presents with  . Leg Swelling  . Emesis  . Headache      HPI on 07/18/2015 by Dr. Lyda Perone Calvin [name] is a 35 y.o. male with h/o cellulitis of the LLE, last occuring 6 years ago, partial seizures. Patient presents to the ED with fever, LLE erythema, pain, swelling. Symptoms onset earlier today after his wife delivered her first child over at [facility] hospital. Have been progressively worsening during the day, associated vomiting just PTA to the ED. Rigors in ED.  Assessment & Plan   Sepsis secondary to Left Lower extremity cellulitis -Upon admission, patient was febrile, with tachycardia, leukocytosis, and elevated lactic acid level -Blood cultures pending -Continue vancomycin -Continue to monitor CBC   History of DVT -LE doppler pending  Seizure disorder -Continue tegretol and vimpat  Code Status: Full  Family Communication: Sister at bedside.  Disposition Plan: Admitted  Time Spent in minutes   30 minutes  Procedures  None  Consults   None  DVT Prophylaxis  lovneox  Lab Results  Component Value Date   PLT 166 07/19/2015    Medications  Scheduled Meds: . carbamazepine  200 mg Oral BID  . enoxaparin (LOVENOX) injection  40 mg Subcutaneous Q24H  . lacosamide  100 mg Oral BID  . vancomycin  1,000 mg Intravenous Q8H   Continuous Infusions: . sodium chloride 125 mL/hr at 07/19/15 1133   PRN Meds:.acetaminophen, acetaminophen, lip balm, morphine injection, morphine injection  Antibiotics    Anti-infectives    Start     Dose/Rate Route Frequency Ordered Stop   07/19/15 0400  vancomycin (VANCOCIN)  IVPB 1000 mg/200 mL premix     1,000 mg 200 mL/hr over 60 Minutes Intravenous Every 8 hours 07/18/15 1956     07/18/15 1915  vancomycin (VANCOCIN) IVPB 1000 mg/200 mL premix     1,000 mg 200 mL/hr over 60 Minutes Intravenous  Once 07/18/15 1912 07/18/15 2058      Subjective:   Calvin [name] seen and examined today.  Patient feels very tired.  He states that his leg starting getting red and swollen yesterday.  Patient denies dizziness, chest pain, shortness of breath, abdominal pain, N/V/D/C, new weakness, numbess, tingling.    Objective:   Filed Vitals:   07/18/15 2030 07/18/15 2225 07/19/15 0431 07/19/15 0622  BP: 138/49 114/88 110/43   Pulse: 119 120 123   Temp:  99.6 F (37.6 C) 103.3 F (39.6 C) 101 F (38.3 C)  TempSrc:  Oral Oral Oral  Resp: 23  24   Height:   (1.905 m)    Weight:  114.5 kg (252 lb 6.8 oz)    SpO2: 100% 100% 97%     Wt Readings from Last 3 Encounters:  07/18/15  114.5 kg (252 lb 6.8 oz)  06/03/15 114.125 kg (251 lb 9.6 oz)  05/27/15 83.915 kg (185 lb)     Intake/Output Summary (Last 24 hours) at 07/19/15 1303 Last data filed at 07/19/15 1133  Gross per 24 hour  Intake 929.17 ml  Output   1300 ml  Net -370.83 ml    Exam  General: Well developed, well nourished, NAD, appears stated age  HEENT: NCAT, mucous membranes moist.   Cardiovascular: S1 S2 auscultated, no rubs, murmurs or gallops. tachycardia  Respiratory: Clear to auscultation bilaterally  Abdomen: Soft, nontender, nondistended, + bowel sounds  Extremities: warm dry without cyanosis clubbing.  +2 LLE edema  Neuro: AAOx3, nonfocal  Skin: Without rashes exudates or nodules, LLE erythema  Psych: Normal affect and demeanor with intact judgement and insight  Data Review   Micro Results No results found for this or any previous visit (from the past 240 hour(s)).  Radiology Reports No results found.  CBC  Recent Labs Lab 07/18/15 1715 07/19/15 0609  WBC 15.9*  11.0*  HGB 14.7 12.4*  HCT 44.1 39.0  PLT 201 166  MCV 83.2 88.0  MCH 27.7 28.0  MCHC 33.3 31.8  RDW 13.1 13.5    Chemistries   Recent Labs Lab 07/18/15 1715 07/19/15 0609  NA 140 140  K 4.2 3.6  CL 101 105  CO2 24 24  GLUCOSE 108* 109*  BUN 13 12  CREATININE 1.29* 1.28*  CALCIUM 9.0 7.9*  AST 38  --   ALT 36  --   ALKPHOS 58  --   BILITOT 0.5  --    ------------------------------------------------------------------------------------------------------------------ estimated creatinine clearance is 111 mL/min (by C-G formula based on Cr of 1.28). ------------------------------------------------------------------------------------------------------------------ No results for input(s): HGBA1C in the last 72 hours. ------------------------------------------------------------------------------------------------------------------ No results for input(s): CHOL, HDL, LDLCALC, TRIG, CHOLHDL, LDLDIRECT in the last 72 hours. ------------------------------------------------------------------------------------------------------------------ No results for input(s): TSH, T4TOTAL, T3FREE, THYROIDAB in the last 72 hours.  Invalid input(s): FREET3 ------------------------------------------------------------------------------------------------------------------ No results for input(s): VITAMINB12, FOLATE, FERRITIN, TIBC, IRON, RETICCTPCT in the last 72 hours.  Coagulation profile No results for input(s): INR, PROTIME in the last 168 hours.  No results for input(s): DDIMER in the last 72 hours.  Cardiac Enzymes No results for input(s): CKMB, TROPONINI, MYOGLOBIN in the last 168 hours.  Invalid input(s): CK ------------------------------------------------------------------------------------------------------------------ Invalid input(s): POCBNP    Calvin [name] D.O. on 07/19/2015 at 1:03 PM  Between 7am to 7pm - Pager - [phone]  After 7pm go to [email or web address] - password  [email or web address]  And look for the night coverage person covering for me after hours  [facility] Office  [phone]

## 2015-07-19 NOTE — Progress Notes (Signed)
CRITICAL VALUE ALERT  Critical value received:  Lactic Acid 3.0  Date of notification:  07/19/15   Time of notification:  0725  Critical value read back:Yes.    Nurse who received alert:  Murrell ReddenErika Lakishia Bourassa  MD notified (1st page):  Mikhail  Time of first page:  0725  MD notified (2nd page):  Time of second page:  Responding MD:  0725  Time MD responded:  Catha GosselinMikhail

## 2015-07-20 ENCOUNTER — Inpatient Hospital Stay (HOSPITAL_COMMUNITY): Payer: BLUE CROSS/BLUE SHIELD

## 2015-07-20 DIAGNOSIS — Z86718 Personal history of other venous thrombosis and embolism: Secondary | ICD-10-CM

## 2015-07-20 LAB — CBC
HEMATOCRIT: 36.5 % — AB (ref 39.0–52.0)
Hemoglobin: 12 g/dL — ABNORMAL LOW (ref 13.0–17.0)
MCH: 28.6 pg (ref 26.0–34.0)
MCHC: 32.9 g/dL (ref 30.0–36.0)
MCV: 86.9 fL (ref 78.0–100.0)
Platelets: 132 10*3/uL — ABNORMAL LOW (ref 150–400)
RBC: 4.2 MIL/uL — ABNORMAL LOW (ref 4.22–5.81)
RDW: 13.3 % (ref 11.5–15.5)
WBC: 15.7 10*3/uL — ABNORMAL HIGH (ref 4.0–10.5)

## 2015-07-20 LAB — BASIC METABOLIC PANEL
ANION GAP: 8 (ref 5–15)
BUN: 8 mg/dL (ref 6–20)
CALCIUM: 7.7 mg/dL — AB (ref 8.9–10.3)
CO2: 25 mmol/L (ref 22–32)
CREATININE: 1.29 mg/dL — AB (ref 0.61–1.24)
Chloride: 103 mmol/L (ref 101–111)
GFR calc Af Amer: >60 mL/min (ref >60)
GFR calc non Af Amer: >60 mL/min (ref >60)
GLUCOSE: 166 mg/dL — AB (ref 65–99)
Potassium: 3.5 mmol/L (ref 3.5–5.1)
Sodium: 136 mmol/L (ref 135–145)

## 2015-07-20 LAB — LACTIC ACID, PLASMA: Lactic Acid, Venous: 1.1 mmol/L (ref 0.5–2.0)

## 2015-07-20 LAB — VANCOMYCIN, TROUGH: Vancomycin Tr: 9 ug/mL — ABNORMAL LOW (ref 10.0–20.0)

## 2015-07-20 MED ORDER — VANCOMYCIN HCL 10 G IV SOLR
1250.0000 mg | Freq: Three times a day (TID) | INTRAVENOUS | Status: DC
  Administered 2015-07-20 – 2015-07-26 (×19): 1250 mg via INTRAVENOUS
  Filled 2015-07-20 (×21): qty 1250

## 2015-07-20 MED ORDER — IBUPROFEN 400 MG PO TABS
400.0000 mg | ORAL_TABLET | Freq: Once | ORAL | Status: AC
  Administered 2015-07-20: 400 mg via ORAL
  Filled 2015-07-20: qty 1
  Filled 2015-07-20: qty 2

## 2015-07-20 NOTE — Progress Notes (Signed)
*  Preliminary Results* Bilateral lower extremity venous duplex completed. Bilateral lower extremities are negative for deep vein thrombosis. There is no evidence of Baker's cyst bilaterally.  Incidental finding: There is a heterogenous area in the left groin, suggestive of a possible enlarged inguinal lymph node.  07/20/2015  Gertie FeyMichelle Rhylan Kagel, RVT, RDCS, RDMS

## 2015-07-20 NOTE — Progress Notes (Signed)
Pharmacy Antibiotic Follow-up Note  Calvin [name] is a 35 y.o. year-old male admitted on 07/18/2015.  The patient is currently on day 3 of Vancomycin rule out sepsis with cellulitis  Assessment/Plan: 1) Vanc trough 9 - subtherapeutic (goal 15-20) on dose 1g q8 2) Change vanc dose to 1250mg  IV q8 3) Recheck trough prn 4) With continued fevers, does gram negative coverage need added such as Rocephin?  Temp (24hrs), Avg:101.3 F (38.5 C), Min:99.6 F (37.6 C), Max:103.1 F (39.5 C)   Recent Labs Lab 07/18/15 1715 07/19/15 0609 07/20/15 0548  WBC 15.9* 11.0* 15.7*     Recent Labs Lab 07/18/15 1715 07/19/15 0609 07/20/15 0548  CREATININE 1.29* 1.28* 1.29*   Estimated Creatinine Clearance: 110.1 mL/min (by C-G formula based on Cr of 1.29).    No Known Allergies  Antimicrobials this admission: 3/11 Vancomycin >>    Levels/dose changes this admission: 3/13 Vanc Trough at 1100 = 9 on 1g q8 - change dose to 1250mg  q8  Microbiology results: 3/11 BCx: sent  Thank you for allowing pharmacy to be a part of this patient's care.   Hessie KnowsJustin M Tristin Vandeusen, PharmD, BCPS Pager [phone] 07/20/2015 12:22 PM

## 2015-07-20 NOTE — Progress Notes (Signed)
[facility]                                                                              Patient Demographics  Calvin [name], is a 35 y.o. male, DOB - 08-12-1980, [id number]  Admit date - 07/18/2015   Admitting Physician Calvin BowJared M Gardner, DO  Outpatient Primary MD for the patient is Calvin ChimeraEESE,BETTI D, MD  LOS - 2   Chief Complaint  Patient presents with  . Leg Swelling  . Emesis  . Headache      HPI on 07/18/2015 by Dr. Lyda PeroneJared [name] Calvin [name] is a 35 y.o. male with h/o cellulitis of the LLE, last occuring 6 years ago, partial seizures. Patient presents to the ED with fever, LLE erythema, pain, swelling. Symptoms onset earlier today after his wife delivered [facility]. Have been progressively worsening during the day, associated vomiting just PTA to the ED. Rigors in ED.  Assessment & Plan   Sepsis secondary to Left Lower extremity cellulitis -Upon admission, patient was febrile, with tachycardia, leukocytosis, and elevated lactic acid level -Leukocytosis mildly elevated this morning, 15.7 -Improving Lactic acid level,  1.1 -Blood cultures Show no growth to date -Continue vancomycin -Continue to monitor CBC   History of DVT -LE doppler negative for DVT, baker's cyst.  Noted Heterogeneous area in the left colon, suggestive of possible enlarged inguinal lymph node  Seizure disorder -Continue tegretol and vimpat  Code Status: Full  Family Communication: Sister at bedside.  Disposition Plan: Admitted  Time Spent in minutes   30 minutes  Procedures  None  Consults   None  DVT Prophylaxis  lovneox  Lab Results  Component Value Date   PLT 132* 07/20/2015    Medications  Scheduled Meds: . carbamazepine  200 mg Oral BID  . enoxaparin (LOVENOX) injection  40 mg Subcutaneous Q24H  . lacosamide  100 mg Oral BID  . vancomycin  1,000 mg Intravenous Q8H   Continuous Infusions: . sodium chloride 125 mL/hr at 07/19/15  1133   PRN Meds:.acetaminophen, acetaminophen, lip balm, morphine injection, morphine injection  Antibiotics    Anti-infectives    Start     Dose/Rate Route Frequency Ordered Stop   07/19/15 0400  vancomycin (VANCOCIN) IVPB 1000 mg/200 mL premix     1,000 mg 200 mL/hr over 60 Minutes Intravenous Every 8 hours 07/18/15 1956     07/18/15 1915  vancomycin (VANCOCIN) IVPB 1000 mg/200 mL premix     1,000 mg 200 mL/hr over 60 Minutes Intravenous  Once 07/18/15 1912 07/18/15 2058      Subjective:   Calvin [name] seen and examined today.  Patient feels the redness in his lower extremity has improved. Is feeling mildly better today. Still feels very tired. Denies any chest pain, shortness of breath, dizziness, abdominal pain.  Objective:   Filed Vitals:   07/20/15 0240 07/20/15 0318 07/20/15 0611 07/20/15 0700  BP:   139/68   Pulse:   120   Temp: 99.6 F (37.6 C) 101 F (38.3 C) 103.1 F (39.5 C) 100 F (37.8 C)  TempSrc:   Oral   Resp:   20   Height:  Weight:      SpO2:   100%     Wt Readings from Last 3 Encounters:  07/18/15 114.5 kg (252 lb 6.8 oz)  06/03/15 114.125 kg (251 lb 9.6 oz)  05/27/15 83.915 kg (185 lb)     Intake/Output Summary (Last 24 hours) at 07/20/15 1124 Last data filed at 07/20/15 0505  Gross per 24 hour  Intake 5265.42 ml  Output   2850 ml  Net 2415.42 ml    Exam  General: Well developed, well nourished, NAD  HEENT: NCAT, mucous membranes moist.   Cardiovascular: S1 S2 auscultated, no rubs, murmurs or gallops. tachycardia  Respiratory: Clear to auscultation bilaterally  Abdomen: Soft, nontender, nondistended, + bowel sounds  Extremities: warm dry without cyanosis clubbing.  +2 LLE edema from the foot extending to the knee, mildly improving  Neuro: AAOx3, nonfocal  Skin: Without rashes exudates or nodules, LLE erythema- mildly improving  Psych: Normal affect and demeanor   Data Review   Micro Results Recent Results (from the  past 240 hour(s))  Culture, blood (routine x 2)     Status: None (Preliminary result)   Collection Time: 07/18/15  3:30 PM  Result Value Ref Range Status   Specimen Description BLOOD RIGHT HAND  Final   Special Requests BOTTLES DRAWN AEROBIC AND ANAEROBIC 5CC  Final   Culture   Final    NO GROWTH 1 DAY Performed at [facility]    Report Status PENDING  Incomplete  Culture, blood (routine x 2)     Status: None (Preliminary result)   Collection Time: 07/18/15  5:15 PM  Result Value Ref Range Status   Specimen Description BLOOD LEFT ARM  Final   Special Requests BOTTLES DRAWN AEROBIC AND ANAEROBIC 5CC  Final   Culture   Final    NO GROWTH 1 DAY Performed at [facility]    Report Status PENDING  Incomplete    Radiology Reports No results found.  CBC  Recent Labs Lab 07/18/15 1715 07/19/15 0609 07/20/15 0548  WBC 15.9* 11.0* 15.7*  HGB 14.7 12.4* 12.0*  HCT 44.1 39.0 36.5*  PLT 201 166 132*  MCV 83.2 88.0 86.9  MCH 27.7 28.0 28.6  MCHC 33.3 31.8 32.9  RDW 13.1 13.5 13.3    Chemistries   Recent Labs Lab 07/18/15 1715 07/19/15 0609 07/20/15 0548  NA 140 140 136  K 4.2 3.6 3.5  CL 101 105 103  CO2 GLUCOSE 108* 109* 166*  BUN CREATININE 1.29* 1.28* 1.29*  CALCIUM 9.0 7.9* 7.7*  AST 38  --   --   ALT 36  --   --   ALKPHOS 58  --   --   BILITOT 0.5  --   --    ------------------------------------------------------------------------------------------------------------------ estimated creatinine clearance is 110.1 mL/min (by C-G formula based on Cr of 1.29). ------------------------------------------------------------------------------------------------------------------ No results for input(s): HGBA1C in the last 72 hours. ------------------------------------------------------------------------------------------------------------------ No results for input(s): CHOL, HDL, LDLCALC, TRIG, CHOLHDL, LDLDIRECT in the last 72  hours. ------------------------------------------------------------------------------------------------------------------ No results for input(s): TSH, T4TOTAL, T3FREE, THYROIDAB in the last 72 hours.  Invalid input(s): FREET3 ------------------------------------------------------------------------------------------------------------------ No results for input(s): VITAMINB12, FOLATE, FERRITIN, TIBC, IRON, RETICCTPCT in the last 72 hours.  Coagulation profile No results for input(s): INR, PROTIME in the last 168 hours.  No results for input(s): DDIMER in the last 72 hours.  Cardiac Enzymes No results for input(s): CKMB, TROPONINI, MYOGLOBIN in the last 168 hours.  Invalid input(s): CK ------------------------------------------------------------------------------------------------------------------ Invalid input(s): POCBNP    Zennie Ayars [name].O. on 07/20/2015 at 11:24 AM  Between 7am to 7pm - Pager - [phone]  After 7pm go to [email or web address] - password [email or web address]  And look for the night coverage person covering for me after hours  [facility] Office  [phone]

## 2015-07-20 NOTE — Care Management Note (Signed)
Case Management Note  Patient Details  Name: Calvin [name] MRN: [id number] Date of Birth: [birth year]  Subjective/Objective: 35 y/o m admitted w/LE cellulitis. From home.                   Action/Plan:d/c plan home.   Expected Discharge Date:                  Expected Discharge Plan:  Home/Self Care  In-House Referral:     Discharge planning Services  CM Consult  Post Acute Care Choice:    Choice offered to:     DME Arranged:    DME Agency:     HH Arranged:    HH Agency:     Status of Service:  In process, will continue to follow  Medicare Important Message Given:    Date Medicare IM Given:    Medicare IM give by:    Date Additional Medicare IM Given:    Additional Medicare Important Message give by:     If discussed at Long Length of Stay Meetings, dates discussed:    Additional Comments:  Lanier ClamMahabir, Samyia Motter, RN 07/20/2015, 2:01 PM

## 2015-07-20 NOTE — Progress Notes (Signed)
Continues to have elevated temps.  This AM it is 103.0 bp 139/68, HR 120, RR 20, sats 100%.  [facility], last LA was 3.0 (first was 4.64), IVF @ 125, on vancomycin, tylenol given and Lenny Pastelom Callahan NP on call notified. Pt continues with c/o HA but no other changes.

## 2015-07-21 ENCOUNTER — Inpatient Hospital Stay (HOSPITAL_COMMUNITY): Payer: BLUE CROSS/BLUE SHIELD

## 2015-07-21 DIAGNOSIS — R0600 Dyspnea, unspecified: Secondary | ICD-10-CM

## 2015-07-21 LAB — CBC
HCT: 36.4 % — ABNORMAL LOW (ref 39.0–52.0)
HEMOGLOBIN: 11.8 g/dL — AB (ref 13.0–17.0)
MCH: 28.2 pg (ref 26.0–34.0)
MCHC: 32.4 g/dL (ref 30.0–36.0)
MCV: 86.9 fL (ref 78.0–100.0)
Platelets: 139 10*3/uL — ABNORMAL LOW (ref 150–400)
RBC: 4.19 MIL/uL — AB (ref 4.22–5.81)
RDW: 13.2 % (ref 11.5–15.5)
WBC: 12.2 10*3/uL — ABNORMAL HIGH (ref 4.0–10.5)

## 2015-07-21 LAB — BASIC METABOLIC PANEL
Anion gap: 8 (ref 5–15)
BUN: 7 mg/dL (ref 6–20)
CHLORIDE: 107 mmol/L (ref 101–111)
CO2: 22 mmol/L (ref 22–32)
Calcium: 7.4 mg/dL — ABNORMAL LOW (ref 8.9–10.3)
Creatinine, Ser: 1.05 mg/dL (ref 0.61–1.24)
GFR calc non Af Amer: >60 mL/min (ref >60)
Glucose, Bld: 146 mg/dL — ABNORMAL HIGH (ref 65–99)
POTASSIUM: 3.6 mmol/L (ref 3.5–5.1)
SODIUM: 137 mmol/L (ref 135–145)

## 2015-07-21 MED ORDER — ONDANSETRON HCL 4 MG/2ML IJ SOLN
4.0000 mg | Freq: Four times a day (QID) | INTRAMUSCULAR | Status: DC | PRN
  Administered 2015-07-21: 4 mg via INTRAVENOUS
  Filled 2015-07-21: qty 2

## 2015-07-21 NOTE — Progress Notes (Signed)
Pt C/O dyspnea this am. 02 sats on rm air 97-98%. Placed on 2lpm n/c 02 for a few minutes for pt's comfort. MD rounding this am and informed. CXR ordered

## 2015-07-21 NOTE — Progress Notes (Signed)
Report received from J. Berger,RN. No change in assessment. Continue plan of care. Calvin [name] 

## 2015-07-21 NOTE — Progress Notes (Signed)
[facility]                                                                              Patient Demographics  Calvin [name], is a 35 y.o. male, DOB - [birth year], WUJ:[id number]  Admit date - 07/18/2015   Admitting Physician Hillary Bow, DO  Outpatient Primary MD for the patient is Karie Chimera, MD  LOS - 3   Chief Complaint  Patient presents with  . Leg Swelling  . Emesis  . Headache      HPI on 07/18/2015 by Dr. Lyda Perone Calvin [name] is a 35 y.o. male with h/o cellulitis of the LLE, last occuring 6 years ago, partial seizures. Patient presents to the ED with fever, LLE erythema, pain, swelling. Symptoms onset earlier today after his wife delivered her first child over at [facility] hospital. Have been progressively worsening during the day, associated vomiting just PTA to the ED. Rigors in ED.  Interim history  Placed on vanc for LLE cellulitis.  Continues to be tachycardic and febrile.  Assessment & Plan   Sepsis secondary to Left Lower extremity cellulitis -Upon admission, patient was febrile, with tachycardia, leukocytosis, and elevated lactic acid level -Leukocytosis mildly improving, 12.2. -Patient continues to be febrile -Improving Lactic acid level,  1.1 -Blood cultures Show no growth to date -Continue vancomycin -Continue to monitor CBC  -Keep LLE elevated  History of DVT -LE doppler negative for DVT, baker's cyst.  Noted Heterogeneous area in the left colon, suggestive of possible enlarged inguinal lymph node  Seizure disorder -Continue tegretol and vimpat  Mild dyspnea -Obtained CXR: no significant acute findings  -Patient maintaining O2 sats at 97-98% on room air -Continue to monitor closely  Code Status: Full  Family Communication: None at bedside.  Disposition Plan: Admitted. Continue to monitor closely- continues to be febrile with tachycardia.  Time Spent in minutes   30 minutes  Procedures  LE doppler  Consults    None  DVT Prophylaxis  lovneox  Lab Results  Component Value Date   PLT 139* 07/21/2015    Medications  Scheduled Meds: . carbamazepine  200 mg Oral BID  . enoxaparin (LOVENOX) injection  40 mg Subcutaneous Q24H  . lacosamide  100 mg Oral BID  . vancomycin  1,250 mg Intravenous 3 times per day   Continuous Infusions: . sodium chloride 125 mL/hr at 07/21/15 1032   PRN Meds:.acetaminophen, acetaminophen, lip balm, morphine injection, morphine injection, ondansetron (ZOFRAN) IV  Antibiotics    Anti-infectives    Start     Dose/Rate Route Frequency Ordered Stop   07/20/15 1300  vancomycin (VANCOCIN) 1,250 mg in sodium chloride 0.9 % 250 mL IVPB     1,250 mg 166.7 mL/hr over 90 Minutes Intravenous 3 times per day 07/20/15 1225     07/19/15 0400  vancomycin (VANCOCIN) IVPB 1000 mg/200 mL premix  Status:  Discontinued     1,000 mg 200 mL/hr over 60 Minutes Intravenous Every 8 hours 07/18/15 1956 07/20/15 1225   07/18/15 1915  vancomycin (VANCOCIN) IVPB 1000 mg/200 mL premix     1,000 mg 200 mL/hr over 60 Minutes Intravenous  Once 07/18/15 1912 07/18/15 2058  Subjective:   Loletha GrayerSamuel [name] seen and examined today. Please feel very tired and weak. Continues to have pain in the left lower extremity as well as edema and erythema and warmth. Some shortness of breath early this morning. Denies any cough or wheezing.  Denies any chest pain,  dizziness, abdominal pain.  Objective:   Filed Vitals:   07/20/15 1408 07/20/15 2104 07/21/15 0431 07/21/15 1000  BP: 126/72 133/67 145/77 152/78  Pulse: 99 107 111 106  Temp: 98.7 F (37.1 C) 99.1 F (37.3 C) 100.4 F (38 C) 100.4 F (38 C)  TempSrc: Oral Oral Oral Oral  Resp: 20 20 20 18   Height:      Weight:      SpO2: 96% 98% 100% 95%    Wt Readings from Last 3 Encounters:  07/18/15 114.5 kg (252 lb 6.8 oz)  06/03/15 114.125 kg (251 lb 9.6 oz)  05/27/15 83.915 kg (185 lb)     Intake/Output Summary (Last 24 hours) at  07/21/15 1057 Last data filed at 07/21/15 0700  Gross per 24 hour  Intake 3934.58 ml  Output   2950 ml  Net 984.58 ml    Exam  General: Well developed, well nourished, no distress  HEENT: NCAT, mucous membranes moist.   Cardiovascular: S1 S2 auscultated, no rubs, murmurs or gallops. tachycardia  Respiratory: Clear to auscultation bilaterally  Abdomen: Soft, nontender, nondistended, + bowel sounds  Extremities: warm dry without cyanosis clubbing.  +2 LLE edema from the foot extending to the knee, mildly improving  Neuro: AAOx3, nonfocal  Skin: Without rashes exudates or nodules, LLE erythema- mildly improving  Psych: Normal affect and demeanor   Data Review   Micro Results Recent Results (from the past 240 hour(s))  Culture, blood (routine x 2)     Status: None (Preliminary result)   Collection Time: 07/18/15  3:30 PM  Result Value Ref Range Status   Specimen Description BLOOD RIGHT HAND  Final   Special Requests BOTTLES DRAWN AEROBIC AND ANAEROBIC 5CC  Final   Culture   Final    NO GROWTH 1 DAY Performed at [facility]    Report Status PENDING  Incomplete  Culture, blood (routine x 2)     Status: None (Preliminary result)   Collection Time: 07/18/15  5:15 PM  Result Value Ref Range Status   Specimen Description BLOOD LEFT ARM  Final   Special Requests BOTTLES DRAWN AEROBIC AND ANAEROBIC 5CC  Final   Culture   Final    NO GROWTH 1 DAY Performed at [facility] [facility]    Report Status PENDING  Incomplete    Radiology Reports Dg Chest 2 View  07/21/2015  CLINICAL DATA:  Cellulitis left leg EXAM: CHEST  2 VIEW COMPARISON:  04/09/2008 FINDINGS: Stable mild cardiac enlargement. The vascular pattern is normal. Mild subsegmental bibasilar atelectasis. No pleural effusion. IMPRESSION: No significant acute findings. Electronically Signed   By: Esperanza Heiraymond  Rubner M.D.   On: 07/21/2015 09:37    CBC  Recent Labs Lab 07/18/15 1715 07/19/15 0609 07/20/15 0548  07/21/15 0508  WBC 15.9* 11.0* 15.7* 12.2*  HGB 14.7 12.4* 12.0* 11.8*  HCT 44.1 39.0 36.5* 36.4*  PLT 201 166 132* 139*  MCV 83.2 88.0 86.9 86.9  MCH 27.7 28.0 28.6 28.2  MCHC 33.3 31.8 32.9 32.4  RDW 13.1 13.5 13.3 13.2    Chemistries   Recent Labs Lab 07/18/15 1715 07/19/15 0609 07/20/15 0548 07/21/15 0508  NA 140 140 136 137  K  4.2 3.6 3.5 3.6  CL 101 105 103 107  CO2 GLUCOSE 108* 109* 166* 146*  BUN CREATININE 1.29* 1.28* 1.29* 1.05  CALCIUM 9.0 7.9* 7.7* 7.4*  AST 38  --   --   --   ALT 36  --   --   --   ALKPHOS 58  --   --   --   BILITOT 0.5  --   --   --    ------------------------------------------------------------------------------------------------------------------ estimated creatinine clearance is 135.3 mL/min (by C-G formula based on Cr of 1.05). ------------------------------------------------------------------------------------------------------------------ No results for input(s): HGBA1C in the last 72 hours. ------------------------------------------------------------------------------------------------------------------ No results for input(s): CHOL, HDL, LDLCALC, TRIG, CHOLHDL, LDLDIRECT in the last 72 hours. ------------------------------------------------------------------------------------------------------------------ No results for input(s): TSH, T4TOTAL, T3FREE, THYROIDAB in the last 72 hours.  Invalid input(s): FREET3 ------------------------------------------------------------------------------------------------------------------ No results for input(s): VITAMINB12, FOLATE, FERRITIN, TIBC, IRON, RETICCTPCT in the last 72 hours.  Coagulation profile No results for input(s): INR, PROTIME in the last 168 hours.  No results for input(s): DDIMER in the last 72 hours.  Cardiac Enzymes No results for input(s): CKMB, TROPONINI, MYOGLOBIN in the last 168 hours.  Invalid input(s):  CK ------------------------------------------------------------------------------------------------------------------ Invalid input(s): POCBNP    Calvin [name] D.O. on 07/21/2015 at 10:57 AM  Between 7am to 7pm - Pager - [phone]  After 7pm go to [email or web address] - password [email or web address]  And look for the night coverage person covering for me after hours  [facility] Office  [phone]

## 2015-07-22 DIAGNOSIS — R05 Cough: Secondary | ICD-10-CM | POA: Diagnosis present

## 2015-07-22 DIAGNOSIS — R059 Cough, unspecified: Secondary | ICD-10-CM | POA: Diagnosis present

## 2015-07-22 DIAGNOSIS — R197 Diarrhea, unspecified: Secondary | ICD-10-CM | POA: Diagnosis present

## 2015-07-22 DIAGNOSIS — L0390 Cellulitis, unspecified: Secondary | ICD-10-CM

## 2015-07-22 DIAGNOSIS — A419 Sepsis, unspecified organism: Principal | ICD-10-CM

## 2015-07-22 LAB — CBC
HCT: 34.7 % — ABNORMAL LOW (ref 39.0–52.0)
HEMOGLOBIN: 11.8 g/dL — AB (ref 13.0–17.0)
MCH: 28 pg (ref 26.0–34.0)
MCHC: 34 g/dL (ref 30.0–36.0)
MCV: 82.2 fL (ref 78.0–100.0)
Platelets: 152 10*3/uL (ref 150–400)
RBC: 4.22 MIL/uL (ref 4.22–5.81)
RDW: 13.1 % (ref 11.5–15.5)
WBC: 10 10*3/uL (ref 4.0–10.5)

## 2015-07-22 LAB — INFLUENZA PANEL BY PCR (TYPE A & B)
H1N1 flu by pcr: NOT DETECTED
INFLAPCR: NEGATIVE
INFLBPCR: NEGATIVE

## 2015-07-22 LAB — BASIC METABOLIC PANEL
Anion gap: 9 (ref 5–15)
BUN: 6 mg/dL (ref 6–20)
CALCIUM: 8 mg/dL — AB (ref 8.9–10.3)
CHLORIDE: 110 mmol/L (ref 101–111)
CO2: 24 mmol/L (ref 22–32)
CREATININE: 1.02 mg/dL (ref 0.61–1.24)
GFR calc Af Amer: >60 mL/min (ref >60)
GFR calc non Af Amer: >60 mL/min (ref >60)
Glucose, Bld: 115 mg/dL — ABNORMAL HIGH (ref 65–99)
Potassium: 3.4 mmol/L — ABNORMAL LOW (ref 3.5–5.1)
SODIUM: 143 mmol/L (ref 135–145)

## 2015-07-22 MED ORDER — PIPERACILLIN-TAZOBACTAM 3.375 G IVPB 30 MIN: 3.3750 g | Freq: Three times a day (TID) | INTRAVENOUS | Status: DC

## 2015-07-22 MED ORDER — PIPERACILLIN-TAZOBACTAM 3.375 G IVPB
3.3750 g | Freq: Three times a day (TID) | INTRAVENOUS | Status: DC
  Administered 2015-07-22 – 2015-07-26 (×13): 3.375 g via INTRAVENOUS
  Filled 2015-07-22 (×15): qty 50

## 2015-07-22 MED ORDER — LEVOFLOXACIN IN D5W 750 MG/150ML IV SOLN: 750.0000 mg | INTRAVENOUS | Status: DC

## 2015-07-22 MED ORDER — KCL IN DEXTROSE-NACL 20-5-0.9 MEQ/L-%-% IV SOLN
INTRAVENOUS | Status: DC
  Administered 2015-07-22 – 2015-07-23 (×2): via INTRAVENOUS
  Filled 2015-07-22 (×3): qty 1000

## 2015-07-22 MED ORDER — PANTOPRAZOLE SODIUM 40 MG PO TBEC
40.0000 mg | DELAYED_RELEASE_TABLET | Freq: Every day | ORAL | Status: DC
  Administered 2015-07-22 – 2015-07-27 (×6): 40 mg via ORAL
  Filled 2015-07-22 (×7): qty 1

## 2015-07-22 MED ORDER — POTASSIUM CHLORIDE CRYS ER 20 MEQ PO TBCR
40.0000 meq | EXTENDED_RELEASE_TABLET | Freq: Once | ORAL | Status: AC
Start: 2015-07-22 — End: 2015-07-22
  Administered 2015-07-22: 40 meq via ORAL
  Filled 2015-07-22: qty 2

## 2015-07-22 MED ORDER — METHYLPREDNISOLONE SODIUM SUCC 40 MG IJ SOLR
40.0000 mg | INTRAMUSCULAR | Status: DC
  Administered 2015-07-22 – 2015-07-25 (×4): 40 mg via INTRAVENOUS
  Filled 2015-07-22 (×4): qty 1
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
## 2015-07-23 LAB — CBC WITH DIFFERENTIAL/PLATELET
BASOS ABS: 0 10*3/uL (ref 0.0–0.1)
BASOS PCT: 0 %
Eosinophils Absolute: 0.1 10*3/uL (ref 0.0–0.7)
Eosinophils Relative: 1 %
HEMATOCRIT: 33.5 % — AB (ref 39.0–52.0)
HEMOGLOBIN: 11.5 g/dL — AB (ref 13.0–17.0)
LYMPHS PCT: 24 %
Lymphs Abs: 2 10*3/uL (ref 0.7–4.0)
MCH: 28 pg (ref 26.0–34.0)
MCHC: 34.3 g/dL (ref 30.0–36.0)
MCV: 81.7 fL (ref 78.0–100.0)
Monocytes Absolute: 1 10*3/uL (ref 0.1–1.0)
Monocytes Relative: 13 %
NEUTROS ABS: 5.2 10*3/uL (ref 1.7–7.7)
NEUTROS PCT: 62 %
Platelets: 186 10*3/uL (ref 150–400)
RBC: 4.1 MIL/uL — AB (ref 4.22–5.81)
RDW: 13.3 % (ref 11.5–15.5)
WBC: 8.3 10*3/uL (ref 4.0–10.5)

## 2015-07-23 LAB — COMPREHENSIVE METABOLIC PANEL
ALBUMIN: 2.6 g/dL — AB (ref 3.5–5.0)
ALK PHOS: 63 U/L (ref 38–126)
ALT: 40 U/L (ref 17–63)
AST: 31 U/L (ref 15–41)
Anion gap: 12 (ref 5–15)
BILIRUBIN TOTAL: 0.4 mg/dL (ref 0.3–1.2)
BUN: 8 mg/dL (ref 6–20)
CO2: 21 mmol/L — ABNORMAL LOW (ref 22–32)
CREATININE: 0.85 mg/dL (ref 0.61–1.24)
Calcium: 8.3 mg/dL — ABNORMAL LOW (ref 8.9–10.3)
Chloride: 111 mmol/L (ref 101–111)
GFR calc Af Amer: >60 mL/min (ref >60)
GFR calc non Af Amer: >60 mL/min (ref >60)
Glucose, Bld: 104 mg/dL — ABNORMAL HIGH (ref 65–99)
Potassium: 4.2 mmol/L (ref 3.5–5.1)
Sodium: 144 mmol/L (ref 135–145)
TOTAL PROTEIN: 6.3 g/dL — AB (ref 6.5–8.1)

## 2015-07-23 LAB — VANCOMYCIN, TROUGH: VANCOMYCIN TR: 15 ug/mL (ref 10.0–20.0)

## 2015-07-23 LAB — MAGNESIUM: MAGNESIUM: 1.8 mg/dL (ref 1.7–2.4)

## 2015-07-23 LAB — TSH: TSH: 0.728 u[IU]/mL (ref 0.350–4.500)

## 2015-07-23 MED ORDER — FUROSEMIDE 10 MG/ML IJ SOLN
20.0000 mg | Freq: Once | INTRAMUSCULAR | Status: AC
  Administered 2015-07-23: 20 mg via INTRAVENOUS
  Filled 2015-07-23: qty 2

## 2015-07-23 MED ORDER — KETOROLAC TROMETHAMINE 30 MG/ML IJ SOLN
30.0000 mg | Freq: Once | INTRAMUSCULAR | Status: AC
  Administered 2015-07-23: 30 mg via INTRAVENOUS
  Filled 2015-07-23: qty 1

## 2015-07-23 NOTE — Progress Notes (Signed)
Pharmacy Antibiotic Follow-up Note  Calvin [name] is a 35 y.o. year-old male admitted on 07/18/2015.  The patient is currently on day 6 of Vancomycin rule out sepsis with cellulitis  Assessment/Plan: 1) Vanc trough 15 - therapeutic (goal 15-20) on dose 1250 mg q8h 2) Change vanc dose to 1250mg  IV q8 3) Recheck trough prn 4) Pt now also started on Zosyn 3.375 gr IV q8h EI.   Temp (24hrs), Avg:98.3 F (36.8 C), Min:98.2 F (36.8 C), Max:98.3 F (36.8 C)   Recent Labs Lab 07/19/15 0609 07/20/15 0548 07/21/15 0508 07/22/15 0531 07/23/15 0516  WBC 11.0* 15.7* 12.2* 10.0 8.3     Recent Labs Lab 07/19/15 0609 07/20/15 0548 07/21/15 0508 07/22/15 0531 07/23/15 0516  CREATININE 1.28* 1.29* 1.05 1.02 0.85   Estimated Creatinine Clearance: 167.1 mL/min (by C-G formula based on Cr of 0.85).    No Known Allergies  Antimicrobials this admission: 3/11 Vancomycin >>  3/15 Zosyn >>  Levels/dose changes this admission: 3/13 Vanc Trough at 1100 = 9 on 1g q8 - change dose to 1250mg  q8 3/16 Vanc trough at 1300 = 15 on 1250 mg IV q8h  Microbiology results: 3/11 BCx: NGTD  Thank you for allowing pharmacy to be a part of this patient's care.   Hessie KnowsJustin M Legge, PharmD, BCPS Pager [phone] 07/23/2015 2:12 PM
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
## 2015-07-23 NOTE — Care Management Note (Signed)
Case Management Note  Patient Details  Name: Calvin [name] MRN: [id number] Date of Birth: 1980-08-26  Subjective/Objective:L leg cellulitis.iv abx. If long term iv abx needed can arrange w/orders.From home.                    Action/Plan:d/c plan home.   Expected Discharge Date:                  Expected Discharge Plan:  Home/Self Care  In-House Referral:     Discharge planning Services  CM Consult  Post Acute Care Choice:    Choice offered to:     DME Arranged:    DME Agency:     HH Arranged:    HH Agency:     Status of Service:  In process, will continue to follow  Medicare Important Message Given:    Date Medicare IM Given:    Medicare IM give by:    Date Additional Medicare IM Given:    Additional Medicare Important Message give by:     If discussed at Long Length of Stay Meetings, dates discussed:    Additional Comments:  Lanier ClamMahabir, Huston Stonehocker, RN 07/23/2015, 2:53 PM

## 2015-07-24 LAB — CBC
HEMATOCRIT: 35.5 % — AB (ref 39.0–52.0)
Hemoglobin: 12.3 g/dL — ABNORMAL LOW (ref 13.0–17.0)
MCH: 28.5 pg (ref 26.0–34.0)
MCHC: 34.6 g/dL (ref 30.0–36.0)
MCV: 82.4 fL (ref 78.0–100.0)
PLATELETS: 240 10*3/uL (ref 150–400)
RBC: 4.31 MIL/uL (ref 4.22–5.81)
RDW: 13.2 % (ref 11.5–15.5)
WBC: 7.5 10*3/uL (ref 4.0–10.5)

## 2015-07-24 LAB — BASIC METABOLIC PANEL
ANION GAP: 11 (ref 5–15)
BUN: 12 mg/dL (ref 6–20)
CALCIUM: 8.4 mg/dL — AB (ref 8.9–10.3)
CO2: 26 mmol/L (ref 22–32)
Chloride: 104 mmol/L (ref 101–111)
Creatinine, Ser: 1.05 mg/dL (ref 0.61–1.24)
Glucose, Bld: 99 mg/dL (ref 65–99)
Potassium: 3.8 mmol/L (ref 3.5–5.1)
Sodium: 141 mmol/L (ref 135–145)

## 2015-07-24 LAB — CULTURE, BLOOD (ROUTINE X 2)
Culture: NO GROWTH
Culture: NO GROWTH
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
## 2015-07-25 MED ORDER — KETOROLAC TROMETHAMINE 30 MG/ML IJ SOLN
30.0000 mg | Freq: Once | INTRAMUSCULAR | Status: AC
  Administered 2015-07-25: 30 mg via INTRAVENOUS
  Filled 2015-07-25: qty 1

## 2015-07-25 MED ORDER — PREDNISONE 20 MG PO TABS
40.0000 mg | ORAL_TABLET | Freq: Every day | ORAL | Status: DC
  Administered 2015-07-26 – 2015-07-27 (×2): 40 mg via ORAL
  Filled 2015-07-25 (×2): qty 2

## 2015-07-25 MED ORDER — FUROSEMIDE 10 MG/ML IJ SOLN
20.0000 mg | Freq: Once | INTRAMUSCULAR | Status: AC
  Administered 2015-07-25: 20 mg via INTRAVENOUS
  Filled 2015-07-25: qty 2
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
## 2015-07-25 NOTE — Progress Notes (Signed)
Pharmacy Antibiotic Follow-up Note  Calvin [name] is a 35 y.o. year-old male admitted on 07/18/2015 with cellulitis, r/o sepsis. Started on Zosyn and pharmacy was asked to dose Vancomycin.   3/18: - Fevers resolved, WBC normalized, SCr improved CrCl >100.  Assessment/Plan: Day 8 - cont Vanc 1250mg  q8h. Day 4 - cont Zosyn 3.375g IV Q8H infused over 4hrs. Clinically improving, considering switch to PO abx soon.  Follow up renal fxn, culture results, and clinical course.   Temp (24hrs), Avg:98.3 F (36.8 C), Min:98 F (36.7 C), Max:98.5 F (36.9 C)   Recent Labs Lab 07/20/15 0548 07/21/15 0508 07/22/15 0531 07/23/15 0516 07/24/15 0450  WBC 15.7* 12.2* 10.0 8.3 7.5     Recent Labs Lab 07/20/15 0548 07/21/15 0508 07/22/15 0531 07/23/15 0516 07/24/15 0450  CREATININE 1.29* 1.05 1.02 0.85 1.05   Estimated Creatinine Clearance: 135.3 mL/min (by C-G formula based on Cr of 1.05).    No Known Allergies  Antimicrobials this admission: 3/11 >> Vanc >>  3/15 >> Zosyn >>  Levels/dose changes this admission: 3/13 VT at 1100: 9 on 1g q8h - change to 1250mg  q8h 3/16 VT at 1300: 15, continue on 1250mg  q8h  Microbiology results: 3/11 BCx: NGF 3/15: flu negative 3/15: C diff: not collected?  Thank you for allowing pharmacy to be a part of this patient's care.  Charolotte Ekeom Ersie Savino, PharmD, pager [phone]. 07/25/2015,1:23 PM.

## 2015-07-26 LAB — BASIC METABOLIC PANEL
Anion gap: 10 (ref 5–15)
BUN: 17 mg/dL (ref 6–20)
CHLORIDE: 108 mmol/L (ref 101–111)
CO2: 25 mmol/L (ref 22–32)
CREATININE: 1.03 mg/dL (ref 0.61–1.24)
Calcium: 8.6 mg/dL — ABNORMAL LOW (ref 8.9–10.3)
GFR calc non Af Amer: >60 mL/min (ref >60)
Glucose, Bld: 111 mg/dL — ABNORMAL HIGH (ref 65–99)
Potassium: 3.5 mmol/L (ref 3.5–5.1)
Sodium: 143 mmol/L (ref 135–145)

## 2015-07-26 LAB — CBC
HCT: 37.3 % — ABNORMAL LOW (ref 39.0–52.0)
Hemoglobin: 12.6 g/dL — ABNORMAL LOW (ref 13.0–17.0)
MCH: 28.2 pg (ref 26.0–34.0)
MCHC: 33.8 g/dL (ref 30.0–36.0)
MCV: 83.4 fL (ref 78.0–100.0)
Platelets: 366 10*3/uL (ref 150–400)
RBC: 4.47 MIL/uL (ref 4.22–5.81)
RDW: 13.4 % (ref 11.5–15.5)
WBC: 9.3 10*3/uL (ref 4.0–10.5)

## 2015-07-26 MED ORDER — POTASSIUM CHLORIDE 20 MEQ/15ML (10%) PO SOLN
40.0000 meq | Freq: Once | ORAL | Status: AC
  Administered 2015-07-26: 40 meq via ORAL
  Filled 2015-07-26: qty 30

## 2015-07-26 MED ORDER — LEVOFLOXACIN 750 MG PO TABS
750.0000 mg | ORAL_TABLET | Freq: Every day | ORAL | Status: DC
  Administered 2015-07-26: 750 mg via ORAL
  Filled 2015-07-26 (×2): qty 1

## 2015-07-26 MED ORDER — AMOXICILLIN-POT CLAVULANATE 875-125 MG PO TABS
1.0000 | ORAL_TABLET | Freq: Two times a day (BID) | ORAL | Status: DC
  Administered 2015-07-26 – 2015-07-27 (×2): 1 via ORAL
  Filled 2015-07-26 (×3): qty 1

## 2015-07-26 MED ORDER — FUROSEMIDE 20 MG PO TABS
20.0000 mg | ORAL_TABLET | Freq: Once | ORAL | Status: AC
Start: 2015-07-26 — End: 2015-07-26
  Administered 2015-07-26: 20 mg via ORAL
  Filled 2015-07-26: qty 1

## 2015-07-26 MED ORDER — IBUPROFEN 600 MG PO TABS
600.0000 mg | ORAL_TABLET | Freq: Once | ORAL | Status: AC
Start: 2015-07-26 — End: 2015-07-26
  Administered 2015-07-26: 600 mg via ORAL
  Filled 2015-07-26: qty 3
  Filled 2015-07-26: qty 1
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
## 2015-07-27 LAB — CBC
HCT: 40.6 % (ref 39.0–52.0)
HEMOGLOBIN: 13.1 g/dL (ref 13.0–17.0)
MCH: 28.2 pg (ref 26.0–34.0)
MCHC: 32.3 g/dL (ref 30.0–36.0)
MCV: 87.3 fL (ref 78.0–100.0)
PLATELETS: 403 10*3/uL — AB (ref 150–400)
RBC: 4.65 MIL/uL (ref 4.22–5.81)
RDW: 13.7 % (ref 11.5–15.5)
WBC: 10.6 10*3/uL — ABNORMAL HIGH (ref 4.0–10.5)

## 2015-07-27 LAB — BASIC METABOLIC PANEL
Anion gap: 10 (ref 5–15)
BUN: 15 mg/dL (ref 6–20)
CALCIUM: 8.9 mg/dL (ref 8.9–10.3)
CO2: 26 mmol/L (ref 22–32)
CREATININE: 0.92 mg/dL (ref 0.61–1.24)
Chloride: 106 mmol/L (ref 101–111)
GFR calc non Af Amer: >60 mL/min (ref >60)
Glucose, Bld: 105 mg/dL — ABNORMAL HIGH (ref 65–99)
Potassium: 4 mmol/L (ref 3.5–5.1)
SODIUM: 142 mmol/L (ref 135–145)

## 2015-07-27 MED ORDER — AMOXICILLIN-POT CLAVULANATE 875-125 MG PO TABS: 1.0000 | ORAL_TABLET | Freq: Two times a day (BID) | ORAL | Status: DC

## 2015-07-27 MED ORDER — LEVOFLOXACIN 750 MG PO TABS: 750.0000 mg | ORAL_TABLET | Freq: Every day | ORAL | Status: DC

## 2015-07-27 MED ORDER — PREDNISONE 10 MG PO TABS: ORAL_TABLET | ORAL | Status: DC
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
## 2015-07-27 NOTE — Care Management Note (Signed)
Case Management Note  Patient Details  Name: Calvin [name] MRN:[name] [id number] Date of Birth: December 11, 1980  Subjective/Objective:                    Action/Plan:d/c home no needs or orders.   Expected Discharge Date:                  Expected Discharge Plan:  Home/Self Care  In-House Referral:     Discharge planning Services  CM Consult  Post Acute Care Choice:    Choice offered to:     DME Arranged:    DME Agency:     HH Arranged:    HH Agency:     Status of Service:  Completed, signed off  Medicare Important Message Given:    Date Medicare IM Given:    Medicare IM give by:    Date Additional Medicare IM Given:    Additional Medicare Important Message give by:     If discussed at Long Length of Stay Meetings, dates discussed:    Additional Comments:  Lanier ClamMahabir, Theo Reither, RN 07/27/2015, 1:13 PM

## 2015-07-27 NOTE — Progress Notes (Signed)
Discharge instructions given to patient. Answered all questions. Patient will be discharged home with family in stable condition.

## 2015-08-07 ENCOUNTER — Other Ambulatory Visit: Payer: Self-pay | Admitting: *Deleted

## 2015-08-07 MED ORDER — LACOSAMIDE 100 MG PO TABS: 100.0000 mg | ORAL_TABLET | Freq: Two times a day (BID) | ORAL | Status: DC

## 2015-08-07 NOTE — Telephone Encounter (Signed)
Asking for 3 month supply.  RV scheduled in 11/2015.

## 2015-08-10 ENCOUNTER — Telehealth: Payer: Self-pay | Admitting: *Deleted

## 2015-08-10 NOTE — Telephone Encounter (Signed)
LMVM for pt that wanted to confirm pharmacy that pt would like to have refill go to. May leave this with operator.

## 2015-08-11 NOTE — Telephone Encounter (Signed)
Called and spoke to patient patient wanted RX to go to . Faxed RX. [id number] - [place], [place] - [address] [phone] (Phone) [phone] (Fax)  Lacosamide 100mg .

## 2015-08-20 ENCOUNTER — Ambulatory Visit: Payer: Self-pay | Admitting: Nurse Practitioner

## 2015-08-21 DIAGNOSIS — Z0289 Encounter for other administrative examinations: Secondary | ICD-10-CM

## 2015-08-25 ENCOUNTER — Telehealth: Payer: Self-pay | Admitting: *Deleted

## 2015-08-25 NOTE — Telephone Encounter (Signed)
Reviewed and signed

## 2015-08-25 NOTE — Telephone Encounter (Signed)
Completed application, sent to CM/NP for review and signature.

## 2015-08-25 NOTE — Telephone Encounter (Signed)
To MR. 

## 2015-08-28 ENCOUNTER — Encounter: Payer: Self-pay | Admitting: Vascular Surgery

## 2015-08-28 ENCOUNTER — Other Ambulatory Visit: Payer: Self-pay | Admitting: *Deleted

## 2015-08-28 DIAGNOSIS — L03116 Cellulitis of left lower limb: Secondary | ICD-10-CM

## 2015-09-21 ENCOUNTER — Other Ambulatory Visit: Payer: Self-pay | Admitting: Nurse Practitioner

## 2015-11-20 ENCOUNTER — Other Ambulatory Visit: Payer: Self-pay | Admitting: Nurse Practitioner

## 2015-11-26 ENCOUNTER — Encounter: Payer: Self-pay | Admitting: Vascular Surgery

## 2015-12-01 ENCOUNTER — Ambulatory Visit (INDEPENDENT_AMBULATORY_CARE_PROVIDER_SITE_OTHER): Payer: BLUE CROSS/BLUE SHIELD | Admitting: Nurse Practitioner

## 2015-12-01 ENCOUNTER — Encounter: Payer: Self-pay | Admitting: Nurse Practitioner

## 2015-12-01 VITALS — BP 118/82 | HR 80 | Ht 75.0 in | Wt 265.0 lb

## 2015-12-01 DIAGNOSIS — G40909 Epilepsy, unspecified, not intractable, without status epilepticus: Secondary | ICD-10-CM | POA: Diagnosis not present

## 2015-12-01 DIAGNOSIS — G40209 Localization-related (focal) (partial) symptomatic epilepsy and epileptic syndromes with complex partial seizures, not intractable, without status epilepticus: Secondary | ICD-10-CM | POA: Diagnosis not present

## 2015-12-01 MED ORDER — CARBAMAZEPINE 200 MG PO TABS: 200.0000 mg | ORAL_TABLET | Freq: Two times a day (BID) | ORAL | 6 refills | Status: DC

## 2015-12-01 NOTE — Progress Notes (Addendum)
[facility]  PATIENT: Calvin [name] DOB: 1980-09-16   REASON FOR VISIT: Follow-up for seizure disorder, partial epilepsy HISTORY FROM: Patient    HISTORY OF PRESENT ILLNESS:UPDATE 12/01/15 [name], 35 year old male returns for follow-up. He has a long-standing history of complex partial seizure disorder with his last seizure occurring 05/27/2015 after not taking his Tegretol and his level was less than 2. He has had a long history of noncompliance in the past. He denies missing any doses of his medications since last seen he was admitted to the hospital in March for cellulitis of the left leg and he has follow-up appointment with vascular physician. He returns for reevaluation   UPDATE 1/25/17CMMr. [name], 35 year-old male returns for followup. He was last seen in this office 08/20/14.  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 He presented to the emergency room where he admitted to not taking his Tegretol and his level was less than 2. He was given a prescription and returned  the next day with another seizure at which time his carbamazepine level was 2.8 and he was started on Vimpat. 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 He needs labs to monitor adherence to his drug regimen. He is aware he cannot drive for 6 months .  HISTORY: Partial complex seizure disorder with last seizure occurring February 2013. Seizures in the past are due to noncompliance with his medications with seizure activity occurring. He was on Keppra and Depakote however he has been switched to carbamazepine by the emergency room in 2010. He does not have PCP.    REVIEW OF SYSTEMS: Full 14 system review of systems performed and notable only for those listed, all others are neg:  Constitutional: neg  Cardiovascular: Leg swelling,  cellulitis Ear/Nose/Throat: neg  Skin: neg Eyes: neg Respiratory: neg Gastroitestinal: neg  Hematology/Lymphatic: neg  Endocrine: neg Musculoskeletal:neg Allergy/Immunology: neg Neurological: Seizure disorder Psychiatric: neg Sleep : neg   ALLERGIES: No Known Allergies  HOME MEDICATIONS: Outpatient Medications Prior to Visit  Medication Sig Dispense Refill  . carbamazepine (TEGRETOL) 200 MG tablet Take 1 tablet (200 mg total) by mouth 2 (two) times daily. 60 tablet 5  . clomiPHENE (CLOMID) 50 MG tablet Take by mouth daily.    Marland Kitchen omega-3 acid ethyl esters (LOVAZA) 1 g capsule Take 1 g by mouth 2 (two) times daily.    Marland Kitchen amoxicillin-clavulanate (AUGMENTIN) 875-125 MG tablet Take 1 tablet by mouth every 12 (twelve) hours. (Patient not taking: Reported on 12/01/2015) 10 tablet 0  . carbamazepine (CARBATROL) 200 MG 12 hr capsule Take 1 capsule (200 mg total) by mouth 2 (two) times daily. (Patient not taking: Reported on 12/01/2015) 60 capsule 5  . Lacosamide 100 MG TABS Take 1 tablet (100 mg total) by mouth 2 (two) times daily. 180 tablet 1  . levofloxacin (LEVAQUIN) 750 MG tablet Take 1 tablet (750 mg total) by mouth at bedtime. (Patient not taking: Reported on 12/01/2015) 6 tablet 0  . predniSONE (DELTASONE) 10 MG tablet Please take 3 tablets daily for 2 days, then 2 tablets daily for 2 days, then 1 tablet daily for 1 day, total 11 tablets. (Patient not taking: Reported on 12/01/2015) 11 tablet 0   No facility-administered medications prior to visit.     PAST MEDICAL HISTORY: Past Medical History:  Diagnosis Date  . Cellulitis of left leg   .  Hypertension   . Seizures (HCC)     PAST SURGICAL HISTORY: Past Surgical History:  Procedure Laterality Date  . TONSILLECTOMY      FAMILY HISTORY: Family History  Problem Relation Age of Onset  . Healthy Mother     SOCIAL HISTORY: Social History   Social History  . Marital status: Married    Spouse name: [name]  . Number of  children: 0  . Years of education: BA   Occupational History  .  Unemployed    [employer]   Social History Main Topics  . Smoking status: Never Smoker  . Smokeless tobacco: Never Used  . Alcohol use No  . Drug use: No  . Sexual activity: Yes   Other Topics Concern  . Not on file   Social History Narrative   his is a 35 year old gentleman. He has never used caffeine, tobacco, alcohol or drugs. He has been married for 3 yrs and has one child. He lives at home with his wife.     PHYSICAL EXAM  Vitals:   12/01/15 1041  BP: 118/82  Pulse: 80  Weight: 265 lb (120.2 kg)  Height: 6\' 3"  (1.905 m)   Body mass index is 33.12 kg/m. Generalized: Well developed, obese male in no acute distress   Neurological examination   Mentation: Alert oriented to time, place, history taking. Follows all commands speech and language fluent  Cranial nerve II-XII: Pupils were equal round reactive to light extraocular movements were full, visual field were full on confrontational test. Facial sensation and strength were normal. hearing was intact to finger rubbing bilaterally. Uvula tongue midline. head turning and shoulder shrug were normal and symmetric.Tongue protrusion into cheek strength was normal. Motor: normal bulk and tone, full strength in the BUE, BLE, fine finger movements normal, no pronator drift. No focal weakness Coordination: finger-nose-finger, heel-to-shin bilaterally, no dysmetria Reflexes: 1+ upper lower and symmetric Gait and Station: Rising up from seated position without assistance, normal stance, moderate stride, good arm swing, smooth turning, able to perform tiptoe, and heel walking without difficulty. Tandem gait is steady    DIAGNOSTIC DATA (LABS, IMAGING, TESTING) - I reviewed patient records, labs, notes, testing and imaging myself where available.  Lab Results  Component Value Date   WBC 10.6 (H) 07/27/2015   HGB 13.1 07/27/2015   HCT 40.6 07/27/2015   MCV  87.3 07/27/2015   PLT 403 (H) 07/27/2015      Component Value Date/Time   NA 142 07/27/2015 0535   NA 142 06/03/2015 1138   K 4.0 07/27/2015 0535   CL 106 07/27/2015 0535   CO2 26 07/27/2015 0535   GLUCOSE 105 (H) 07/27/2015 0535   BUN 15 07/27/2015 0535   BUN 15 06/03/2015 1138   CREATININE 0.92 07/27/2015 0535   CALCIUM 8.9 07/27/2015 0535   PROT 6.3 (L) 07/23/2015 0516   PROT 7.2 06/03/2015 1138   ALBUMIN 2.6 (L) 07/23/2015 0516   ALBUMIN 4.5 06/03/2015 1138   AST 31 07/23/2015 0516   ALT 40 07/23/2015 0516   ALKPHOS 63 07/23/2015 0516   BILITOT 0.4 07/23/2015 0516   BILITOT <0.2 06/03/2015 1138   GFRNONAA >60 07/27/2015 0535   GFRAA >60 07/27/2015 0535    ASSESSMENT AND PLAN  35 y.o. year old male  has a past medical history of Cellulitis of left leg; Hypertension; and Seizures (HCC). here to follow-up . Last seizure 05/27/2015  occurred at home witnessed by his wife. Carbamazepine level less than  2. He returned the next day for another seizure carbamazepine level 2.8 and Vimpat was started in addition to carbamazepine. He has a history of noncompliance in the past. He denies missing any medication in the last 6 months. The patient is a current patient of Dr. Pearlean Brownie  who is out of the office today . This note is sent to the work in doctor.     PLAN: Continue Tegretol 200 mg twice daily will refill Continue Vimpat 100 twice daily does not need refills May resume driving Follow-up in 6 months Nilda Riggs, [facility], [facility], APRN  [facility] [address], [address] [place], [place] [address] [phone]  I reviewed the above note and documentation by the Nurse Practitioner and agree with the history, physical exam, assessment and plan as outlined above. I was immediately available for face-to-face consultation. Huston Foley, MD, PhD [facility])

## 2015-12-01 NOTE — Patient Instructions (Signed)
Continue Tegretol 200 mg twice daily will refill Continue Vimpat 100 twice daily does not need refills Resume driving Follow-up in 6 months

## 2015-12-03 ENCOUNTER — Other Ambulatory Visit: Payer: Self-pay | Admitting: Nurse Practitioner

## 2015-12-03 NOTE — Telephone Encounter (Signed)
Pt asking for the carbatrol (carbamazepine ER) 200mg   Capsules instead to tablets.   Taking 200mg  po bid.

## 2015-12-04 ENCOUNTER — Ambulatory Visit (INDEPENDENT_AMBULATORY_CARE_PROVIDER_SITE_OTHER): Payer: BLUE CROSS/BLUE SHIELD | Admitting: Vascular Surgery

## 2015-12-04 ENCOUNTER — Encounter: Payer: Self-pay | Admitting: Vascular Surgery

## 2015-12-04 ENCOUNTER — Other Ambulatory Visit: Payer: Self-pay | Admitting: *Deleted

## 2015-12-04 ENCOUNTER — Ambulatory Visit (HOSPITAL_COMMUNITY)
Admission: RE | Admit: 2015-12-04 | Discharge: 2015-12-04 | Disposition: A | Discharge status: Home / Self Care | Payer: BLUE CROSS/BLUE SHIELD | Source: Ambulatory Visit | Attending: Vascular Surgery | Admitting: Vascular Surgery

## 2015-12-04 VITALS — BP 132/89 | HR 73 | Temp 97.0°F | Resp 16 | Ht 75.0 in | Wt 262.0 lb

## 2015-12-04 DIAGNOSIS — L03116 Cellulitis of left lower limb: Secondary | ICD-10-CM | POA: Diagnosis present

## 2015-12-04 DIAGNOSIS — I890 Lymphedema, not elsewhere classified: Secondary | ICD-10-CM

## 2015-12-04 DIAGNOSIS — I872 Venous insufficiency (chronic) (peripheral): Secondary | ICD-10-CM

## 2015-12-04 DIAGNOSIS — I10 Essential (primary) hypertension: Secondary | ICD-10-CM | POA: Diagnosis not present

## 2015-12-04 DIAGNOSIS — I8392 Asymptomatic varicose veins of left lower extremity: Secondary | ICD-10-CM | POA: Diagnosis not present

## 2015-12-04 NOTE — Addendum Note (Signed)
Addended byHermenia Fiscal on: 12/04/2015 09:43 AM   Modules accepted: Orders
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
## 2015-12-08 ENCOUNTER — Telehealth: Payer: Self-pay | Admitting: Vascular Surgery

## 2015-12-08 NOTE — Telephone Encounter (Signed)
Called patient and left message for him on cell ph# regarding the referral [facility] made on 12/04/15 to the lymphedema clinic. I faxed office notes and the referral to them on 12/07/15 and they will contact pt w/ an appt. Drinda Butts t

## 2015-12-23 ENCOUNTER — Other Ambulatory Visit: Payer: Self-pay | Admitting: Nurse Practitioner

## 2016-02-17 ENCOUNTER — Emergency Department (HOSPITAL_COMMUNITY)
Admission: EM | Admit: 2016-02-17 | Discharge: 2016-02-17 | Disposition: A | Discharge status: Home / Self Care | Payer: BLUE CROSS/BLUE SHIELD | Attending: Emergency Medicine | Admitting: Emergency Medicine

## 2016-02-17 ENCOUNTER — Encounter (HOSPITAL_COMMUNITY): Payer: Self-pay | Admitting: Emergency Medicine

## 2016-02-17 DIAGNOSIS — L03116 Cellulitis of left lower limb: Secondary | ICD-10-CM | POA: Diagnosis not present

## 2016-02-17 DIAGNOSIS — M7989 Other specified soft tissue disorders: Secondary | ICD-10-CM | POA: Diagnosis present

## 2016-02-17 DIAGNOSIS — I10 Essential (primary) hypertension: Secondary | ICD-10-CM | POA: Diagnosis not present

## 2016-02-17 MED ORDER — SULFAMETHOXAZOLE-TRIMETHOPRIM 800-160 MG PO TABS: 1.0000 | ORAL_TABLET | Freq: Two times a day (BID) | ORAL | 0 refills | Status: AC

## 2016-02-17 MED ORDER — SULFAMETHOXAZOLE-TRIMETHOPRIM 800-160 MG PO TABS
1.0000 | ORAL_TABLET | Freq: Once | ORAL | Status: AC
  Administered 2016-02-17: 1 via ORAL
  Filled 2016-02-17: qty 1

## 2016-02-17 MED ORDER — CEPHALEXIN 500 MG PO CAPS: 500.0000 mg | ORAL_CAPSULE | Freq: Four times a day (QID) | ORAL | 0 refills | Status: DC

## 2016-02-17 MED ORDER — CEPHALEXIN 250 MG PO CAPS
500.0000 mg | ORAL_CAPSULE | Freq: Once | ORAL | Status: AC
  Administered 2016-02-17: 500 mg via ORAL
  Filled 2016-02-17: qty 2

## 2016-02-17 NOTE — ED Triage Notes (Signed)
Pt presents with LEFT leg swelling since last night at 23:00; pt states he was at work when he noticed pain and swelling; pt denies any known injury; redness and warmth noted; pt states he has a history of cellulitis

## 2016-02-17 NOTE — ED Provider Notes (Signed)
[facility]-EMERGENCY DEPT Provider Note   CSN: [id number] Arrival date & time: 02/17/16  4098  By signing my name below, I, Doreatha Martin, attest that this documentation has been prepared under the direction and in the presence of Derwood Kaplan, MD. Electronically Signed: Doreatha Martin, ED Scribe. 02/17/16. 4:00 AM.     History   Chief Complaint Chief Complaint  Patient presents with  . Leg Swelling    unilateral- LEFT    HPI Calvin [name] is a 35 y.o. male who presents to the Emergency Department complaining of moderate, gradually worsening left lower extremity swelling onset at 11 pm last night with associated redness, throbbing pain. Pt was admitted to the hospital on 07/18/15 for tx of LLE cellulitis with 1 week IV Vanc/Zosyn, 6 days PO Levaquin/Augmentin. He reports that his left leg has been slightly more swollen than the right since he has been treated for cellulitis; however, the swelling significantly worsened last night. Pt states his current symptoms are similar to h/o cellulitis. Pt states his pain is worsened with ambulation and weight-bearing. Per wife, pt has SCDs at home and states the pt has not been using the device regularly. No h/o PE.   The history is provided by the patient. No language interpreter was used.    Past Medical History:  Diagnosis Date  . Cellulitis of left leg   . Hypertension   . Seizures [facility])     Patient Active Problem List   Diagnosis Date Noted  . Lymphedema 12/04/2015  . Diarrhea 07/22/2015  . Cough 07/22/2015  . Sepsis due to cellulitis (HCC) 07/18/2015  . Cellulitis of left leg 07/18/2015  . Personal history of DVT (deep vein thrombosis) 07/18/2015  . Hypertension 10/25/2012  . 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 Seizure disorder (HCC) 03/06/2007  . DEEP VENOUS THROMBOPHLEBITIS, LEG, LEFT 02/23/2007    Past Surgical History:  Procedure Laterality Date  . TONSILLECTOMY         Home Medications      Prior to Admission medications   Medication Sig Start Date End Date Taking? Authorizing Provider  carbamazepine (CARBATROL) 200 MG 12 hr capsule Take 200mg  (one capsule) by mouth twice daily. Patient not taking: Reported on 12/04/2015 12/03/15   Nilda Riggs, NP  carbamazepine (TEGRETOL) 200 MG tablet Take 200 mg by mouth 3 (three) times daily.    Historical Provider, MD  cephALEXin (KEFLEX) 500 MG capsule Take 1 capsule (500 mg total) by mouth 4 (four) times daily. 02/17/16   Derwood Kaplan, MD  clomiPHENE (CLOMID) 50 MG tablet Take by mouth daily.    Historical Provider, MD  omega-3 acid ethyl esters (LOVAZA) 1 g capsule Take 1 g by mouth 2 (two) times daily.    Historical Provider, MD  sulfamethoxazole-trimethoprim (BACTRIM DS,SEPTRA DS) 800-160 MG tablet Take 1 tablet by mouth 2 (two) times daily. 02/17/16 02/24/16  Vincie Linn, MD  VIMPAT 100 MG TABS Take 1 tablet by mouth 2 (two) times daily. 11/20/15   Historical Provider, MD    Family History Family History  Problem Relation Age of Onset  . Healthy Mother     Social History Social History  Substance Use Topics  . Smoking status: Never Smoker  . Smokeless tobacco: Never Used  . Alcohol use No     Allergies   Review of patient's allergies indicates no known allergies.   Review of Systems Review of Systems A complete 10 system review of systems was obtained and all  systems are negative except as noted in the HPI and PMH.    Physical Exam Updated Vital Signs BP 127/74   Pulse 80   Temp 97.6 F (36.4 C) (Oral)   Resp 18   Ht 6\' 3"  (1.905 m)   Wt 255 lb (115.7 kg)   SpO2 97%   BMI 31.87 kg/m   Physical Exam  Constitutional: He appears well-developed and well-nourished.  HENT:  Head: Normocephalic.  Eyes: Conjunctivae are normal.  Cardiovascular: Normal rate, regular rhythm and normal heart sounds.   Pulmonary/Chest: Effort normal and breath sounds normal. No respiratory distress. He has no wheezes.   Lungs CTA bilaterally.   Abdominal: He exhibits no distension.  Musculoskeletal: Normal range of motion. He exhibits edema and tenderness.  Unilateral LLE edema and erythema. TTP diffusely. 2+ DP bilaterally.   Neurological: He is alert.  Skin: Skin is warm and dry. There is erythema.  Psychiatric: He has a normal mood and affect. His behavior is normal.  Nursing note and vitals reviewed.         ED Treatments / Results   DIAGNOSTIC STUDIES: Oxygen Saturation is 97% on RA, normal by my interpretation.    COORDINATION OF CARE: 3:56 AM Discussed treatment plan with pt at bedside which includes antibiotics and pt agreed to plan.    Labs (all labs ordered are listed, but only abnormal results are displayed) Labs Reviewed - No data to display  EKG  EKG Interpretation None       Radiology No results found.  07/20/2015 [place]: Bilateral lower extremity venous duplex completed. Bilateral lower extremities are negative for deep vein thrombosis. There is no evidence of Baker's cyst bilaterally.  Incidental finding: There is a heterogenous area in the left groin, suggestive of a possible enlarged inguinal lymph node.  07/20/2015   Procedures Procedures (including critical care time)  Medications Ordered in ED Medications  cephALEXin (KEFLEX) capsule 500 mg (500 mg Oral Given 02/17/16 0413)  sulfamethoxazole-trimethoprim (BACTRIM DS,SEPTRA DS) 800-160 MG per tablet 1 tablet (1 tablet Oral Given 02/17/16 0413)     Initial Impression / Assessment and Plan / ED Course  I have reviewed the triage vital signs and the nursing notes.  Pertinent labs & imaging results that were available during my care of the patient were reviewed by me and considered in my medical decision making (see chart for details).  Clinical Course   I personally performed the services described in this documentation, which was scribed in my presence. The recorded information has been reviewed and is  accurate.     Final Clinical Impressions(s) / ED Diagnoses   Final diagnoses:  Cellulitis of left lower extremity   Pt comes in with cc of leg swelling. Hx of DVT in the remote past, and more recently cellulitis with similar presentation. Current symptoms started recently. Pt is immunocompetent and non toxic. 0 SIRS on vital signs. Pt has erythema and warmth. No crepitus. Currently -he has no systemic signs, and we will opt for conservative management and start him on keflex and bactrim. Pt was transitioned to levo and augmentin last admission - but that was so because pt was being converted from iv to oral dose. Pt made aware that if the symptoms get worse, he will have to return. Strict ER return precautions have been discussed, and patient is agreeing with the plan and is comfortable with the workup done and the recommendations from the ER.    New Prescriptions New Prescriptions   CEPHALEXIN (  KEFLEX) 500 MG CAPSULE    Take 1 capsule (500 mg total) by mouth 4 (four) times daily.   SULFAMETHOXAZOLE-TRIMETHOPRIM (BACTRIM DS,SEPTRA DS) 800-160 MG TABLET    Take 1 tablet by mouth 2 (two) times daily.        Derwood KaplanAnkit Susie Ehresman, MD 02/17/16 [phone]

## 2016-02-17 NOTE — Discharge Instructions (Signed)
Please return to the ER if your symptoms worsen; you have increased pain, fevers, chills, SWELLING. Otherwise see the outpatient doctor as requested.

## 2016-03-02 ENCOUNTER — Other Ambulatory Visit: Payer: Self-pay | Admitting: Nurse Practitioner

## 2016-03-14 ENCOUNTER — Telehealth: Payer: Self-pay | Admitting: Nurse Practitioner

## 2016-03-14 NOTE — Telephone Encounter (Signed)
Pt called said he was advise by [facility] that carbamazepine (CARBATROL) 200 MG 12 hr capsule has no refills. Please call

## 2016-03-14 NOTE — Telephone Encounter (Signed)
I called and spoke to pharmacy and there is a prescription ready for pt. I relayed to pt and he verbalized understanding.

## 2016-06-02 ENCOUNTER — Encounter: Payer: Self-pay | Admitting: Nurse Practitioner

## 2016-06-02 ENCOUNTER — Ambulatory Visit (INDEPENDENT_AMBULATORY_CARE_PROVIDER_SITE_OTHER): Payer: BLUE CROSS/BLUE SHIELD | Admitting: Nurse Practitioner

## 2016-06-02 VITALS — BP 115/79 | HR 83 | Ht 75.0 in | Wt 261.2 lb

## 2016-06-02 DIAGNOSIS — G40909 Epilepsy, unspecified, not intractable, without status epilepticus: Secondary | ICD-10-CM

## 2016-06-02 DIAGNOSIS — Z5181 Encounter for therapeutic drug level monitoring: Secondary | ICD-10-CM

## 2016-06-02 DIAGNOSIS — G40209 Localization-related (focal) (partial) symptomatic epilepsy and epileptic syndromes with complex partial seizures, not intractable, without status epilepticus: Secondary | ICD-10-CM | POA: Diagnosis not present

## 2016-06-02 MED ORDER — VIMPAT 100 MG PO TABS: 1.0000 | ORAL_TABLET | Freq: Two times a day (BID) | ORAL | 5 refills | Status: DC

## 2016-06-02 NOTE — Progress Notes (Addendum)
[facility]  PATIENT: Calvin [name] DOB: [birth year]   REASON FOR VISIT: Follow-up for seizure disorder, partial epilepsy HISTORY FROM: Patient    HISTORY OF PRESENT ILLNESS:UPDATE 01/25/2018[name], 36 year old male returns for follow-up with history of complex partial seizure disorder with his last seizure occurring 05/27/2015 after missing doses of his Tegretol. He does have a history of noncompliance in the past. He tells me today that he was unable to get his Vimpat for the last 3 months according to the pharmacy because he needed a prior authorization. I see no communication of that in the chart. He has been off of his Vimpat since October just taking his carbamazepine. He was given refills  at his last appointment to last to this appointment. Fortunately he has not had any seizure activity. He returns for reevaluation   UPDATE 12/01/15 [name], 36 year old male returns for follow-up. He has a long-standing history of complex partial seizure disorder with his last seizure occurring 05/27/2015 after not taking his Tegretol and his level was less than 2. He has had a long history of noncompliance in the past. He denies missing any doses of his medications since last seen he was admitted to the hospital in March for cellulitis of the left leg and he has follow-up appointment with vascular physician. He returns for reevaluation   UPDATE 1/25/17CMMr. [name], 36 year-old male returns for followup. He was last seen in this office 08/20/14.  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 He presented to the emergency room where he admitted to not taking his Tegretol and his level was less than 2. He was given a prescription and returned  the next day with another seizure at which time his carbamazepine level was 2.8 and he was started on Vimpat. 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 He needs labs to monitor adherence to his drug regimen. He is aware he cannot drive for 6 months .  HISTORY: Partial complex seizure disorder with last seizure occurring February 2013. Seizures in the past are due to noncompliance with his medications with seizure activity occurring. He was on Keppra and Depakote however he has been switched to carbamazepine by the emergency room in 2010. He does not have PCP.    REVIEW OF SYSTEMS: Full 14 system review of systems performed and notable only for those listed, all others are neg:  Constitutional: neg  Cardiovascular: neg Ear/Nose/Throat: neg  Skin: neg Eyes: neg Respiratory: neg Gastroitestinal: neg  Hematology/Lymphatic: neg  Endocrine: neg Musculoskeletal:neg Allergy/Immunology: neg Neurological: Seizure disorder Psychiatric: neg Sleep : neg   ALLERGIES: No Known Allergies  HOME MEDICATIONS: Outpatient Medications Prior to Visit  Medication Sig Dispense Refill  . carbamazepine (TEGRETOL) 200 MG tablet Take 200 mg by mouth 3 (three) times daily.    Calvin [name] VIMPAT 100 MG TABS Take 1 tablet by mouth 2 (two) times daily.  5  . carbamazepine (CARBATROL) 200 MG 12 hr capsule Take 200mg  (one capsule) by mouth twice daily. (Patient not taking: Reported on 12/04/2015) 60 capsule 5  . cephALEXin (KEFLEX) 500 MG capsule Take 1 capsule (500 mg total) by mouth 4 (four) times daily. 28 capsule 0  . clomiPHENE (CLOMID) 50 MG tablet Take by mouth daily.    Calvin [name] omega-3 acid ethyl esters (LOVAZA) 1 g capsule Take 1 g by mouth 2 (two)  times daily.     No facility-administered medications prior to visit.     PAST MEDICAL HISTORY: Past Medical History:  Diagnosis Date  . Cellulitis of left leg   . Hypertension   . Seizures (HCC)     PAST SURGICAL HISTORY: Past Surgical History:  Procedure Laterality Date  . TONSILLECTOMY      FAMILY HISTORY: Family History  Problem Relation  Age of Onset  . Healthy Mother     SOCIAL HISTORY: Social History   Social History  . Marital status: Married    Spouse name: [name]  . Number of children: 0  . Years of education: BA   Occupational History  .  Unemployed    [employer]   Social History Main Topics  . Smoking status: Never Smoker  . Smokeless tobacco: Never Used  . Alcohol use No  . Drug use: No  . Sexual activity: Yes   Other Topics Concern  . Not on file   Social History Narrative   his is a 36 year old gentleman. He has never used caffeine, tobacco, alcohol or drugs. He has been married for 3 yrs and has one child. He lives at home with his wife.     PHYSICAL EXAM  Vitals:   06/02/16 1005  BP: 115/79  Pulse: 83  Weight: 261 lb 3.2 oz (118.5 kg)  Height: 6\' 3"  (1.905 m)   Body mass index is 32.65 kg/m. Generalized: Well developed, obese male in no acute distress   Neurological examination   Mentation: Alert oriented to time, place, history taking. Follows all commands speech and language fluent  Cranial nerve II-XII: Pupils were equal round reactive to light extraocular movements were full, visual field were full on confrontational test. Facial sensation and strength were normal. hearing was intact to finger rubbing bilaterally. Uvula tongue midline. head turning and shoulder shrug were normal and symmetric.Tongue protrusion into cheek strength was normal. Motor: normal bulk and tone, full strength in the BUE, BLE, fine finger movements normal, no pronator drift. No focal weakness Coordination: finger-nose-finger, heel-to-shin bilaterally, no dysmetria Reflexes: 1+ upper lower and symmetric Gait and Station: Rising up from seated position without assistance, normal stance, moderate stride, good arm swing, smooth turning, able to perform tiptoe, and heel walking without difficulty. Tandem gait is steady    DIAGNOSTIC DATA (LABS, IMAGING, TESTING) - I reviewed patient records, labs, notes,  testing and imaging myself where available.  Lab Results  Component Value Date   WBC 10.6 (H) 07/27/2015   HGB 13.1 07/27/2015   HCT 40.6 07/27/2015   MCV 87.3 07/27/2015   PLT 403 (H) 07/27/2015      Component Value Date/Time   NA 142 07/27/2015 0535   NA 142 06/03/2015 1138   K 4.0 07/27/2015 0535   CL 106 07/27/2015 0535   CO2 26 07/27/2015 0535   GLUCOSE 105 (H) 07/27/2015 0535   BUN 15 07/27/2015 0535   BUN 15 06/03/2015 1138   CREATININE 0.92 07/27/2015 0535   CALCIUM 8.9 07/27/2015 0535   PROT 6.3 (L) 07/23/2015 0516   PROT 7.2 06/03/2015 1138   ALBUMIN 2.6 (L) 07/23/2015 0516   ALBUMIN 4.5 06/03/2015 1138   AST 31 07/23/2015 0516   ALT 40 07/23/2015 0516   ALKPHOS 63 07/23/2015 0516   BILITOT 0.4 07/23/2015 0516   BILITOT <0.2 06/03/2015 1138   GFRNONAA >60 07/27/2015 0535   GFRAA >60 07/27/2015 0535    ASSESSMENT AND PLAN  36 y.o. year  old male  has a past medical history of  Hypertension; and Seizures (HCC). here to follow-up . Last seizure 05/27/2015  occurred at home witnessed by his wife. Vimpat was started in addition to carbamazepine. He has a history of noncompliance in the past. He has not taken his Vimpat for several months because he was told by the pharmacy that it needed prior authorization, I  see no documentation of that in the medical record The patient is a current patient of Dr. Pearlean BrownieSethi  who is out of the office today . This note is sent to the work in doctor.     PLAN: Continue Tegretol 200 mg twice daily will refill once labs are back Continue Vimpat 100 twice daily RX to pt CBC, CMP CBZ level today Call for any seizure activity Follow-up in 1 year I spent 20 in total face to face time with the patient more than 50% of which was spent counseling and coordination of care, reviewing test results reviewing medications and discussing and reviewing the diagnosis of seizure disorder and importance of taking medications as prescribed. If you're unable to  get  medication from the pharmacy  notify us by telephone. Patient was made aware that Vimpat is controlled substance and he can only get 5 refills with each prescription. Nilda RiggsNancy Carolyn Abhimanyu Cruces, [facility] InstituteGNP, [facility]ekal[facility] HospitalBC, APRN  [facility] [address], [address] [place], [place] [address] [phone]  I reviewed the above note and documentation by the Nurse Practitioner and agree with the history, physical exam, assessment and plan as outlined above. I was immediately available for face-to-face consultation. Huston FoleySaima Athar, MD, PhD [facility]

## 2016-06-02 NOTE — Patient Instructions (Addendum)
Continue Tegretol 200 mg twice daily will refill once labs back Continue Vimpat 100 twice daily RX to pt CBC, CMP CBZ level today Call for any seizure activity Follow-up in 1 year

## 2016-06-03 ENCOUNTER — Other Ambulatory Visit: Payer: Self-pay | Admitting: Nurse Practitioner

## 2016-06-03 ENCOUNTER — Telehealth: Payer: Self-pay | Admitting: *Deleted

## 2016-06-03 LAB — CBC WITH DIFFERENTIAL/PLATELET
BASOS ABS: 0 10*3/uL (ref 0.0–0.2)
Basos: 0 %
EOS (ABSOLUTE): 0.1 10*3/uL (ref 0.0–0.4)
EOS: 1 %
Hematocrit: 42.2 % (ref 37.5–51.0)
Hemoglobin: 14.3 g/dL (ref 13.0–17.7)
IMMATURE GRANULOCYTES: 0 %
Immature Grans (Abs): 0 10*3/uL (ref 0.0–0.1)
LYMPHS ABS: 2.5 10*3/uL (ref 0.7–3.1)
Lymphs: 45 %
MCH: 28.9 pg (ref 26.6–33.0)
MCHC: 33.9 g/dL (ref 31.5–35.7)
MCV: 85 fL (ref 79–97)
MONOS ABS: 0.4 10*3/uL (ref 0.1–0.9)
Monocytes: 7 %
NEUTROS PCT: 47 %
Neutrophils Absolute: 2.6 10*3/uL (ref 1.4–7.0)
PLATELETS: 182 10*3/uL (ref 150–379)
RBC: 4.94 x10E6/uL (ref 4.14–5.80)
RDW: 14.4 % (ref 12.3–15.4)
WBC: 5.6 10*3/uL (ref 3.4–10.8)

## 2016-06-03 LAB — COMPREHENSIVE METABOLIC PANEL
A/G RATIO: 1.3 (ref 1.2–2.2)
ALK PHOS: 66 IU/L (ref 39–117)
ALT: 25 IU/L (ref 0–44)
AST: 20 IU/L (ref 0–40)
Albumin: 4.1 g/dL (ref 3.5–5.5)
BUN/Creatinine Ratio: 15 (ref 9–20)
BUN: 15 mg/dL (ref 6–20)
Bilirubin Total: <0.2 mg/dL (ref 0.0–1.2)
CHLORIDE: 100 mmol/L (ref 96–106)
CO2: 23 mmol/L (ref 18–29)
Calcium: 9 mg/dL (ref 8.7–10.2)
Creatinine, Ser: 1 mg/dL (ref 0.76–1.27)
GFR calc Af Amer: 112 mL/min/{1.73_m2} (ref >59)
GFR, EST NON AFRICAN AMERICAN: 97 mL/min/{1.73_m2} (ref >59)
Globulin, Total: 3.1 g/dL (ref 1.5–4.5)
Glucose: 100 mg/dL — ABNORMAL HIGH (ref 65–99)
POTASSIUM: 4 mmol/L (ref 3.5–5.2)
SODIUM: 140 mmol/L (ref 134–144)
Total Protein: 7.2 g/dL (ref 6.0–8.5)

## 2016-06-03 LAB — CARBAMAZEPINE LEVEL, TOTAL: CARBAMAZEPINE LVL: 5.2 ug/mL (ref 4.0–12.0)

## 2016-06-03 MED ORDER — CARBAMAZEPINE 200 MG PO TABS: 200.0000 mg | ORAL_TABLET | Freq: Three times a day (TID) | ORAL | 3 refills | Status: DC

## 2016-06-03 NOTE — Telephone Encounter (Signed)
-----   Message from Nilda RiggsNancy Carolyn Martin, NP sent at 06/03/2016  8:09 AM EST ----- Labs ok please call the patient

## 2016-06-03 NOTE — Telephone Encounter (Signed)
LMVM [employer] for pt that labs looked good.  He is to call back for questions.

## 2016-10-20 ENCOUNTER — Other Ambulatory Visit: Payer: Self-pay | Admitting: Neurology

## 2016-11-21 ENCOUNTER — Other Ambulatory Visit: Payer: Self-pay | Admitting: Neurology

## 2016-11-21 ENCOUNTER — Telehealth: Payer: Self-pay | Admitting: Nurse Practitioner

## 2016-11-21 NOTE — Telephone Encounter (Signed)
Received refill requests for carbamazepine. My last note says patient is taking it twice a day and he had good levels of the drug however the pharmacy prescription says 3 times a day. Call to patient to confirm his current dosage

## 2016-11-22 NOTE — Telephone Encounter (Signed)
Call placed to patient again regarding his current dose of carbamazepine left message to return call

## 2016-11-23 ENCOUNTER — Encounter: Payer: Self-pay | Admitting: Nurse Practitioner

## 2016-11-23 MED ORDER — CARBAMAZEPINE 200 MG PO TABS: 200.0000 mg | ORAL_TABLET | Freq: Two times a day (BID) | ORAL | 6 refills | Status: DC

## 2016-11-23 NOTE — Telephone Encounter (Signed)
I have called this pt twice to confirm dosage of CBZ.He has not called back.  Please call his wife and see if she knows what he takes.

## 2016-11-23 NOTE — Telephone Encounter (Signed)
Spoke with wife who read from patient's bottle of Carbamazepine. Patient takes 200 mg, 2 times daily. Wife then asked if he is supposed to continue taking Vimpat. This RN read last office note and confirmed with wife he is to continue taking Vimpat as well. She stated he is almost out of carbamazepine. Confirmed his pharmacy and told wife a refill will be sent. She verbalized understanding, appreciation.

## 2017-01-04 IMAGING — CT CT HEAD W/O CM
2 series · 15 of 30 positions shown, 17 images · non-contrast
Comparison: 12/31/2007 and 01/01/2008

CLINICAL DATA: Seizure yesterday and today.  Fall.

EXAM:
CT HEAD WITHOUT CONTRAST
TECHNIQUE: Contiguous axial images were obtained from the base of the skull
through the vertex without intravenous contrast.

[Series 2: head without · axial · non-contrast · 0.45mm/px · z∈[-105,+15]mm · 7 of 33 slices shown, 9 images]
[im 5/33  brain]
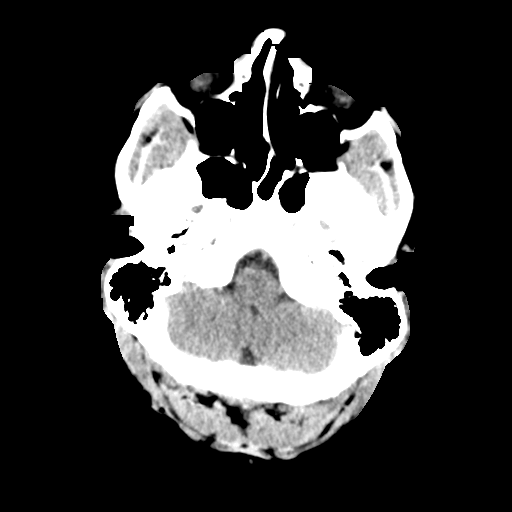
[im 5/33  bone]
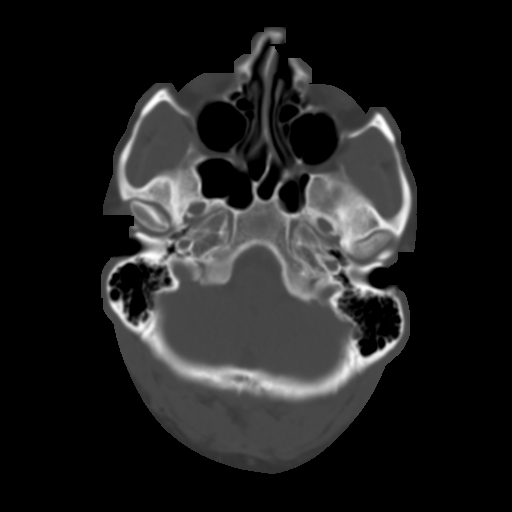
[im 9/33  brain]
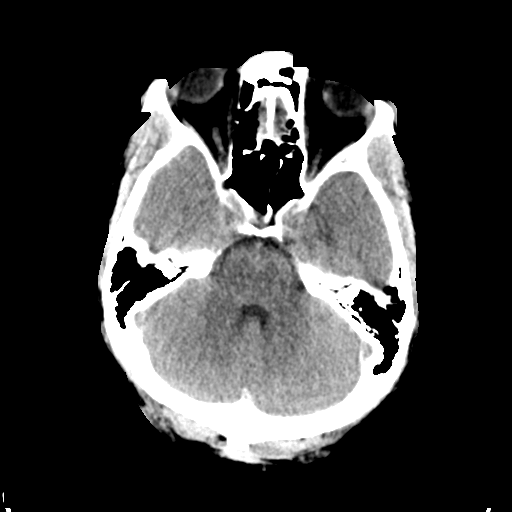
[im 13/33  brain]
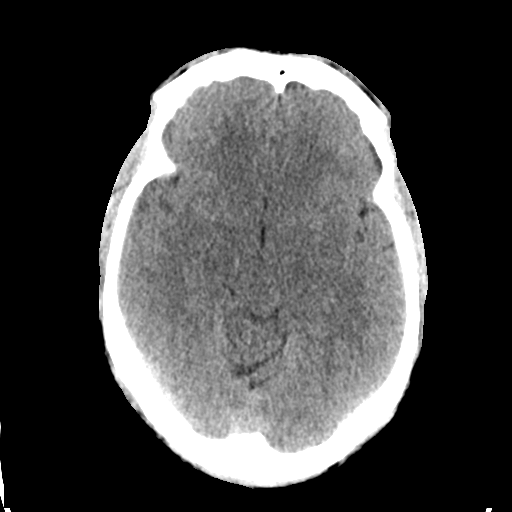
[im 17/33  brain]
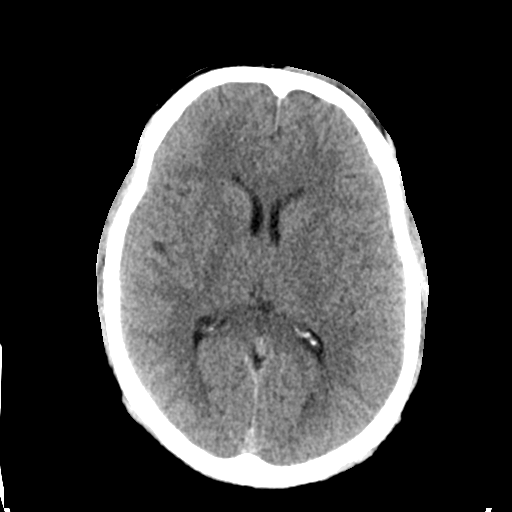
[im 21/33  brain]
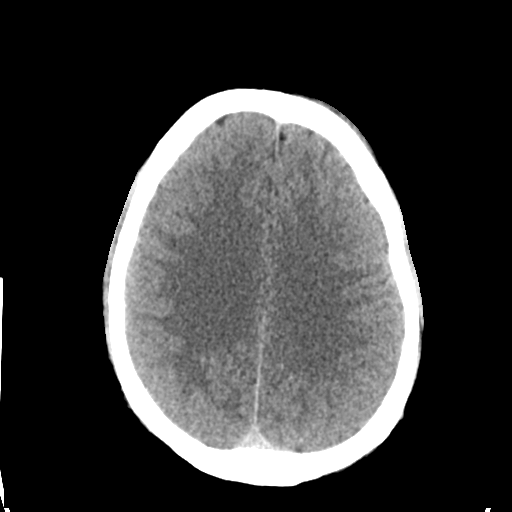
[im 21/33  bone]
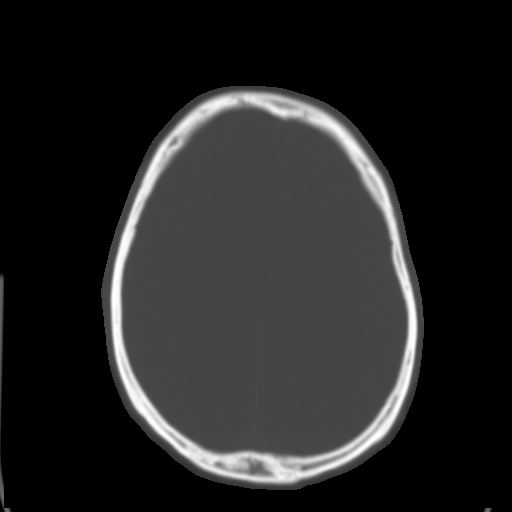
[im 25/33  brain]
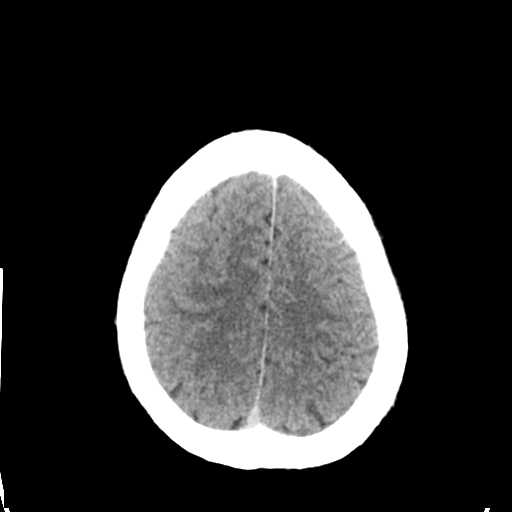
[im 29/33  brain]
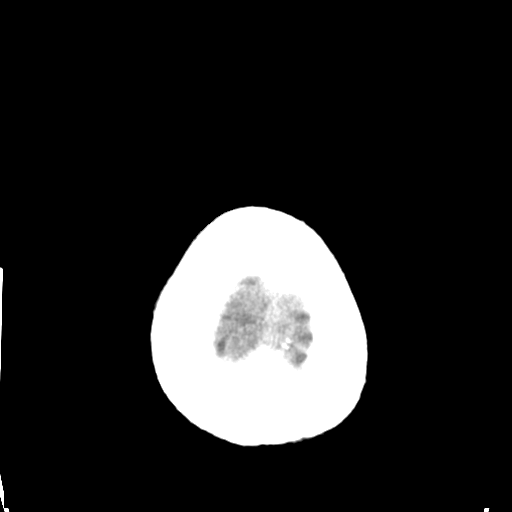

[Series 3: head bone · axial · 0.45mm/px · z∈[-109,+21]mm · 8 of 83 slices shown]
[im 9/83  bone]
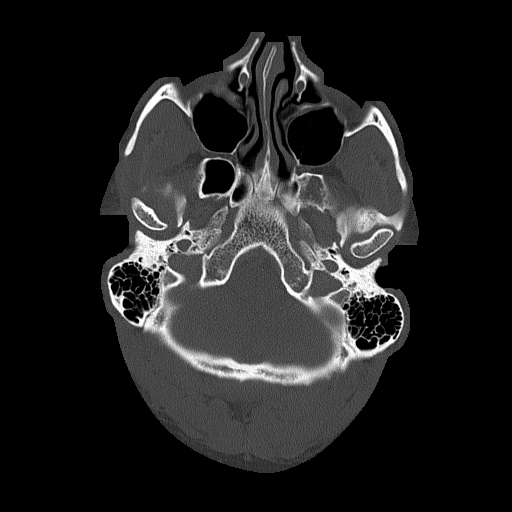
[im 17/83  bone]
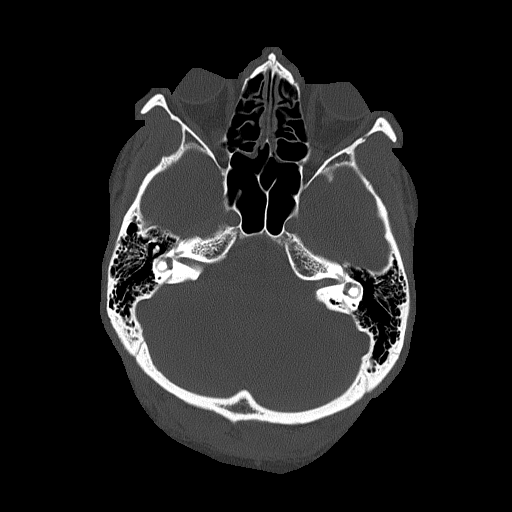
[im 25/83  bone]
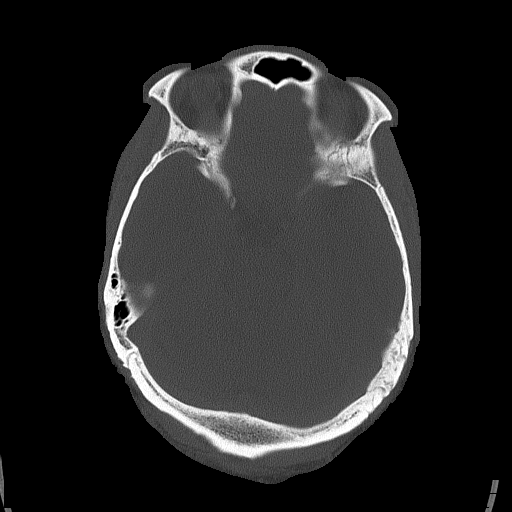
[im 37/83  bone]
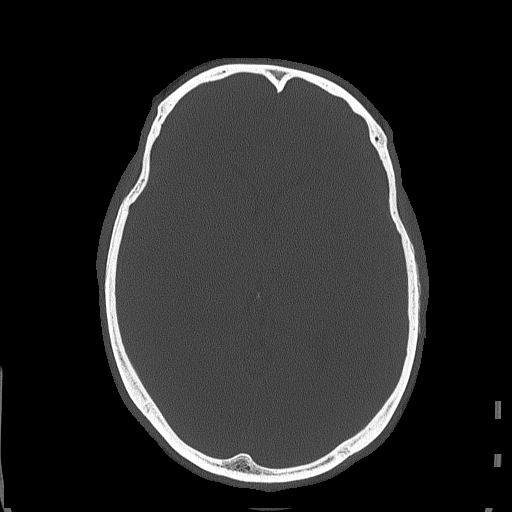
[im 46/83  bone]
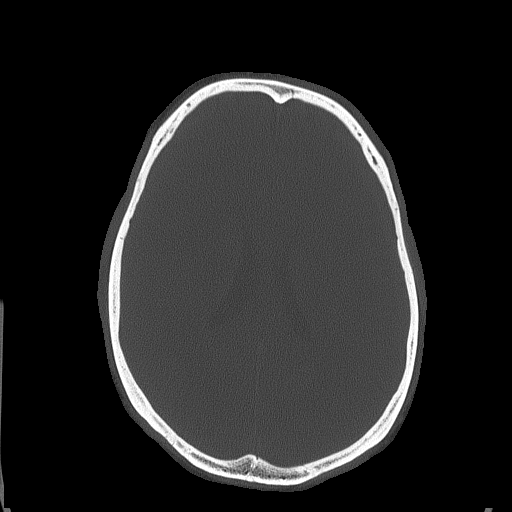
[im 58/83  bone]
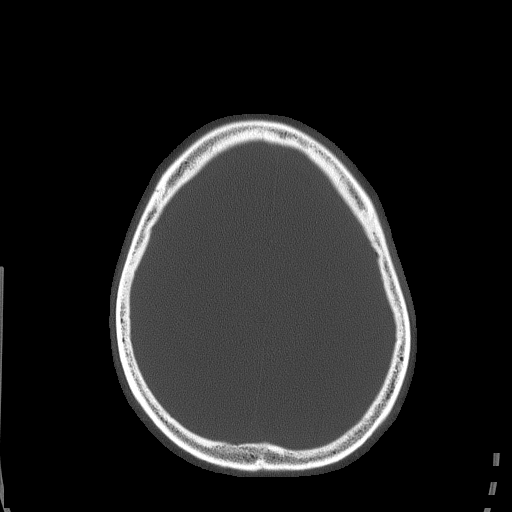
[im 66/83  bone]
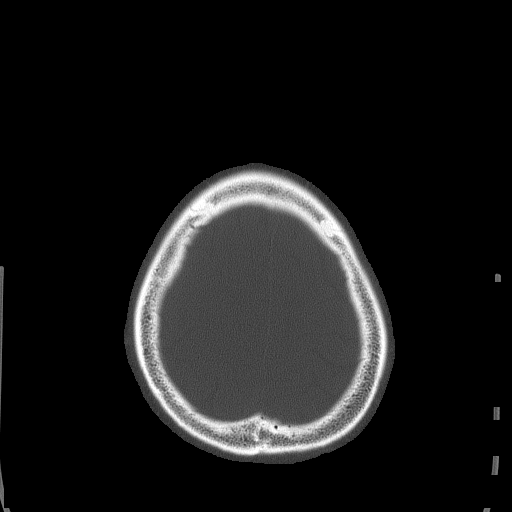
[im 74/83  bone]
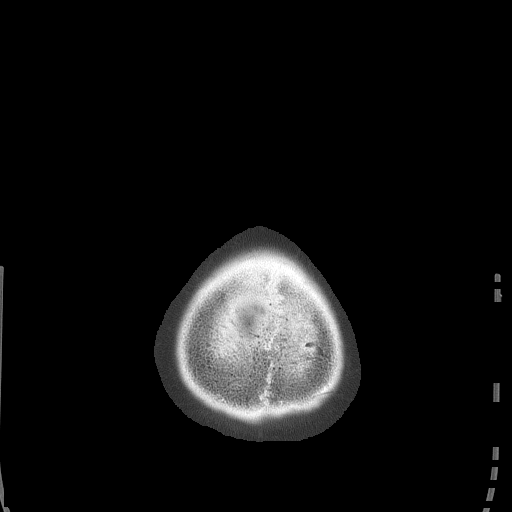

[15 of 30 positions shown; findings below may reference images not displayed]

FINDINGS: The brainstem, cerebellum, cerebral peduncles, thalami, basal
ganglia, basilar cisterns, and ventricular system appear within
normal limits. No intracranial hemorrhage, mass lesion, or acute
CVA.

Left forehead scalp hematoma observed. No underlying calvarial
fracture or acute bony findings. Visualized paranasal sinuses appear
clear.
IMPRESSION: 1. No acute intracranial findings.
2. Left forehead scalp hematoma without underlying calvarial
abnormality.

## 2017-02-17 ENCOUNTER — Encounter (HOSPITAL_COMMUNITY): Payer: Self-pay | Admitting: *Deleted

## 2017-02-17 ENCOUNTER — Emergency Department (HOSPITAL_COMMUNITY): Payer: BLUE CROSS/BLUE SHIELD

## 2017-02-17 ENCOUNTER — Emergency Department (HOSPITAL_COMMUNITY)
Admission: EM | Admit: 2017-02-17 | Discharge: 2017-02-17 | Disposition: A | Discharge status: Home / Self Care | Payer: BLUE CROSS/BLUE SHIELD | Attending: Emergency Medicine | Admitting: Emergency Medicine

## 2017-02-17 DIAGNOSIS — I10 Essential (primary) hypertension: Secondary | ICD-10-CM | POA: Insufficient documentation

## 2017-02-17 DIAGNOSIS — R1084 Generalized abdominal pain: Secondary | ICD-10-CM | POA: Insufficient documentation

## 2017-02-17 DIAGNOSIS — Z79899 Other long term (current) drug therapy: Secondary | ICD-10-CM | POA: Insufficient documentation

## 2017-02-17 DIAGNOSIS — R197 Diarrhea, unspecified: Secondary | ICD-10-CM | POA: Insufficient documentation

## 2017-02-17 DIAGNOSIS — K529 Noninfective gastroenteritis and colitis, unspecified: Secondary | ICD-10-CM | POA: Diagnosis not present

## 2017-02-17 DIAGNOSIS — R112 Nausea with vomiting, unspecified: Secondary | ICD-10-CM | POA: Diagnosis present

## 2017-02-17 LAB — COMPREHENSIVE METABOLIC PANEL
ALT: 22 U/L (ref 17–63)
AST: 22 U/L (ref 15–41)
Albumin: 3.9 g/dL (ref 3.5–5.0)
Alkaline Phosphatase: 54 U/L (ref 38–126)
Anion gap: 11 (ref 5–15)
BUN: 10 mg/dL (ref 6–20)
CO2: 24 mmol/L (ref 22–32)
Calcium: 8.4 mg/dL — ABNORMAL LOW (ref 8.9–10.3)
Chloride: 101 mmol/L (ref 101–111)
Creatinine, Ser: 1.2 mg/dL (ref 0.61–1.24)
GFR calc Af Amer: >60 mL/min (ref >60)
GFR calc non Af Amer: >60 mL/min (ref >60)
Glucose, Bld: 116 mg/dL — ABNORMAL HIGH (ref 65–99)
Potassium: 3.7 mmol/L (ref 3.5–5.1)
Sodium: 136 mmol/L (ref 135–145)
Total Bilirubin: 0.6 mg/dL (ref 0.3–1.2)
Total Protein: 7.2 g/dL (ref 6.5–8.1)

## 2017-02-17 LAB — CBC
HCT: 43.5 % (ref 39.0–52.0)
Hemoglobin: 14.3 g/dL (ref 13.0–17.0)
MCH: 27.9 pg (ref 26.0–34.0)
MCHC: 32.9 g/dL (ref 30.0–36.0)
MCV: 84.8 fL (ref 78.0–100.0)
Platelets: 166 10*3/uL (ref 150–400)
RBC: 5.13 MIL/uL (ref 4.22–5.81)
RDW: 12.8 % (ref 11.5–15.5)
WBC: 6.8 10*3/uL (ref 4.0–10.5)

## 2017-02-17 LAB — URINALYSIS, ROUTINE W REFLEX MICROSCOPIC
Bilirubin Urine: NEGATIVE
Glucose, UA: NEGATIVE mg/dL
Hgb urine dipstick: NEGATIVE
Ketones, ur: NEGATIVE mg/dL
LEUKOCYTES UA: NEGATIVE
NITRITE: NEGATIVE
Protein, ur: NEGATIVE mg/dL
SPECIFIC GRAVITY, URINE: 1.006 (ref 1.005–1.030)
pH: 7 (ref 5.0–8.0)

## 2017-02-17 LAB — LIPASE, BLOOD: LIPASE: 34 U/L (ref 11–51)

## 2017-02-17 MED ORDER — SODIUM CHLORIDE 0.9 % IV BOLUS (SEPSIS)
1000.0000 mL | Freq: Once | INTRAVENOUS | Status: AC
  Administered 2017-02-17: 1000 mL via INTRAVENOUS

## 2017-02-17 MED ORDER — ONDANSETRON 4 MG PO TBDP
4.0000 mg | ORAL_TABLET | Freq: Once | ORAL | Status: AC | PRN
Start: 2017-02-17 — End: 2017-02-17
  Administered 2017-02-17: 4 mg via ORAL

## 2017-02-17 MED ORDER — DICYCLOMINE HCL 20 MG PO TABS: 20.0000 mg | ORAL_TABLET | Freq: Two times a day (BID) | ORAL | 0 refills | Status: DC

## 2017-02-17 MED ORDER — ONDANSETRON 4 MG PO TBDP
ORAL_TABLET | ORAL | Status: AC
  Filled 2017-02-17: qty 1

## 2017-02-17 MED ORDER — ONDANSETRON HCL 4 MG PO TABS: 4.0000 mg | ORAL_TABLET | Freq: Three times a day (TID) | ORAL | 0 refills | Status: DC | PRN

## 2017-02-17 MED ORDER — DICYCLOMINE HCL 10 MG/ML IM SOLN
20.0000 mg | Freq: Once | INTRAMUSCULAR | Status: AC
  Administered 2017-02-17: 20 mg via INTRAMUSCULAR
  Filled 2017-02-17: qty 2

## 2017-02-17 NOTE — ED Provider Notes (Signed)
[facility]EMERGENCY DEPT Provider Note   CSN: [id number] Arrival date & time: 02/17/17  0600  History   Chief Complaint Chief Complaint  Patient presents with  . Abdominal Pain  . Emesis  . Diarrhea    HPI Calvin [name] is a 36 y.o. male.  HPI   Patient has a PMH of hypertension and seizures presents to the ER with complaints of vomiting, diarrhea and stomach cramps/pain. He admits that he ate at [employer] last night. His wife ate here as well and was also having some diarrhea and vomiting but was not having symptoms as severe.  He has not had any seizures, fevers, chills, weakness, syncope, fatigue,confusion, severe abdominal pain or back pain, no SOB.  Past Medical History:  Diagnosis Date  . Cellulitis of left leg   . Hypertension   . Seizures [facility])     Patient Active Problem List   Diagnosis Date Noted  . Encounter for therapeutic drug monitoring 06/02/2016  . Lymphedema 12/04/2015  . Diarrhea 07/22/2015  . Cough 07/22/2015  . Sepsis due to cellulitis (HCC) 07/18/2015  . Cellulitis of left leg 07/18/2015  . Personal history of DVT (deep vein thrombosis) 07/18/2015  . Hypertension 10/25/2012  . 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 Seizure disorder (HCC) 03/06/2007  . DEEP VENOUS THROMBOPHLEBITIS, LEG, LEFT 02/23/2007    Past Surgical History:  Procedure Laterality Date  . TONSILLECTOMY         Home Medications    Prior to Admission medications   Medication Sig Start Date End Date Taking? Authorizing Provider  carbamazepine (TEGRETOL) 200 MG tablet Take 1 tablet (200 mg total) by mouth 2 (two) times daily. 11/23/16  Yes Nilda Riggs, NP  Phenylephrine-Pheniramine-DM [facility] COLD & COUGH PO) Take 1 Applicatorful by mouth 2 (two) times daily as needed (cold).   Yes [provider]  polyethylene glycol (MIRALAX / GLYCOLAX) packet Take 17 g by mouth daily as needed for mild constipation.   Yes [provider]  dicyclomine (BENTYL) 20 MG tablet Take 1 tablet (20 mg total) by mouth 2 (two) times daily. 02/17/17   Neva Seat, Kiley Torrence, PA-C  ondansetron (ZOFRAN) 4 MG tablet Take 1 tablet (4 mg total) by mouth every 8 (eight) hours as needed for nausea or vomiting. 02/17/17   Marlon Pel, PA-C    Family History Family History  Problem Relation Age of Onset  . Healthy Mother     Social History Social History  Substance Use Topics  . Smoking status: Never Smoker  . Smokeless tobacco: Never Used  . Alcohol use No     Allergies   Patient has no known allergies.   Review of Systems Review of Systems Negative ROS aside from pertinent positives and negatives as listed in HPI   Physical Exam Updated Vital Signs BP 138/84 (BP Location: Right Arm)   Pulse (!) 105   Temp 98.7 F (37.1 C) (Oral)   Resp 18   SpO2 94%   Physical Exam  Constitutional: He appears well-developed and well-nourished. No distress.  HENT:  Head: Normocephalic and atraumatic.  Right Ear: Tympanic membrane and ear canal normal.  Left Ear: Tympanic membrane and ear canal normal.  Nose: Nose normal.  Mouth/Throat: Uvula is midline, oropharynx is clear and moist and mucous membranes are normal.  Eyes: Pupils are equal, round, and reactive to light.  Neck: Normal range of motion. Neck supple.  Cardiovascular: Normal rate and regular rhythm.   Pulmonary/Chest: Effort normal.  Abdominal: Soft. He exhibits no distension, no fluid wave, no pulsatile midline mass and no mass. Bowel sounds are increased. There is no tenderness. No hernia.  No signs of abdominal distention  Musculoskeletal:  No LE swelling  Neurological: He is alert.  Acting at baseline  Skin: Skin is warm and dry. No rash noted.  Nursing note and vitals reviewed.    ED Treatments / Results  Labs (all labs ordered are listed, but only abnormal results are displayed) Labs Reviewed  COMPREHENSIVE METABOLIC PANEL - Abnormal; Notable  for the following:       Result Value   Glucose, Bld 116 (*)    Calcium 8.4 (*)    All other components within normal limits  URINALYSIS, ROUTINE W REFLEX MICROSCOPIC - Abnormal; Notable for the following:    Color, Urine STRAW (*)    All other components within normal limits  LIPASE, BLOOD  CBC    EKG  EKG Interpretation None       Radiology Dg Abd Acute W/chest  Result Date: 02/17/2017 CLINICAL DATA:  Nausea, vomiting, abdominal pain EXAM: DG ABDOMEN ACUTE W/ 1V CHEST COMPARISON:  07/21/2015 chest x-ray FINDINGS: There is no evidence of dilated bowel loops or free intraperitoneal air. No radiopaque calculi or other significant radiographic abnormality is seen. Heart size and mediastinal contours are within normal limits. Both lungs are clear. IMPRESSION: Negative abdominal radiographs.  No acute cardiopulmonary disease. Electronically Signed   By: Charlett Nose M.D.   On: 02/17/2017 08:21    Procedures Procedures (including critical care time)  Medications Ordered in ED Medications  ondansetron (ZOFRAN-ODT) disintegrating tablet 4 mg (4 mg Oral Given 02/17/17 0615)  sodium chloride 0.9 % bolus 1,000 mL (0 mLs Intravenous Stopped 02/17/17 0822)  dicyclomine (BENTYL) injection 20 mg (20 mg Intramuscular Given 02/17/17 0726)    Initial Impression / Assessment and Plan / ED Course  I have reviewed the triage vital signs and the nursing notes.  Pertinent labs & imaging results that were available during my care of the patient were reviewed by me and considered in my medical decision making (see chart for details).     Patients labs are unremarkable with no significant abnormalities. His abdominal xray is normal. He is feeling much better after Zofran, Bentyl and fluids. He has been given return to ER precautions, will give him an rx for Bentyl and Zofran and advised to stay hydrated and stick to the [employer]. I also advised him to eat healthy whole foods instead of fast  foods. In room, pulse rechecked by myself prior to discharge which is now 85.  Blood pressure 138/84, pulse (!) 105, temperature 98.7 F (37.1 C), temperature source Oral, resp. rate 18, SpO2 94 %.  Calvin [name] has been evaluated today in the emergency department. The appropriate screening and testing was been performed and I believe the patient to be medically stable for discharge.   Return signs and symptoms have been discussed with the patient and/or caregivers and they have voiced their understanding. The patient has agreed to follow-up with their primary care provider or the referred specialist.   Final Clinical Impressions(s) / ED Diagnoses   Final diagnoses:  Gastroenteritis    New Prescriptions New Prescriptions   DICYCLOMINE (BENTYL) 20 MG TABLET    Take 1 tablet (20 mg total) by mouth 2 (two) times daily.   ONDANSETRON (ZOFRAN) 4 MG TABLET    Take 1 tablet (4 mg total) by mouth every 8 (eight)  hours as needed for nausea or vomiting.     Marlon Pel, PA-C 02/17/17 1610    Tegeler, Canary Brim, MD 02/17/17 [phone]

## 2017-02-17 NOTE — ED Triage Notes (Signed)
States he ate at [employer]  Last pm and started vomiting and having diarrhea, states his wife ate the same was sick but not as bad.

## 2017-02-26 ENCOUNTER — Other Ambulatory Visit: Payer: Self-pay | Admitting: Nurse Practitioner

## 2017-02-27 IMAGING — DX DG CHEST 2V
2 series · 2 of 2 positions shown · non-contrast
Comparison: 04/09/2008

CLINICAL DATA: Cellulitis left leg

EXAM:
CHEST  2 VIEW

[chest pa]
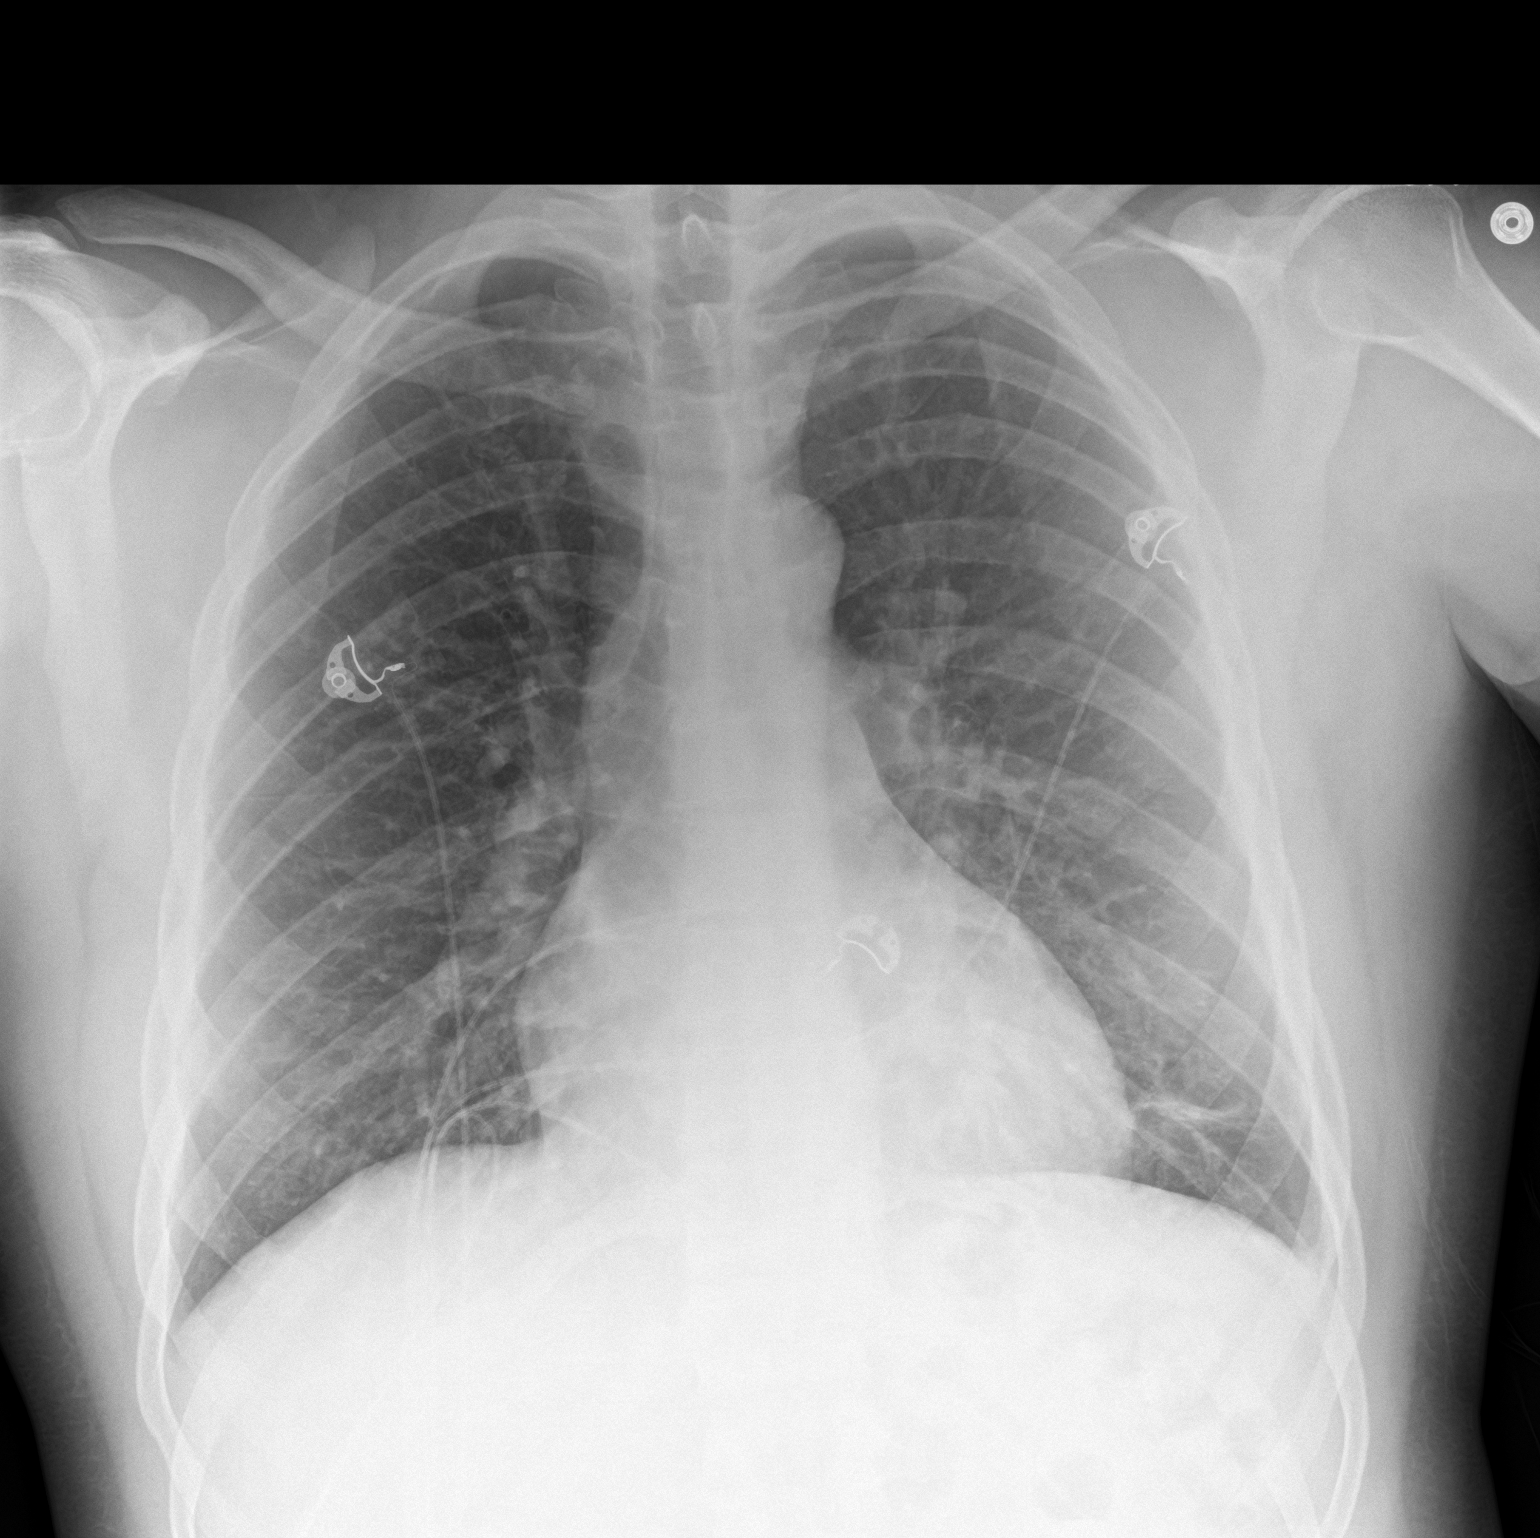

[chest lat]
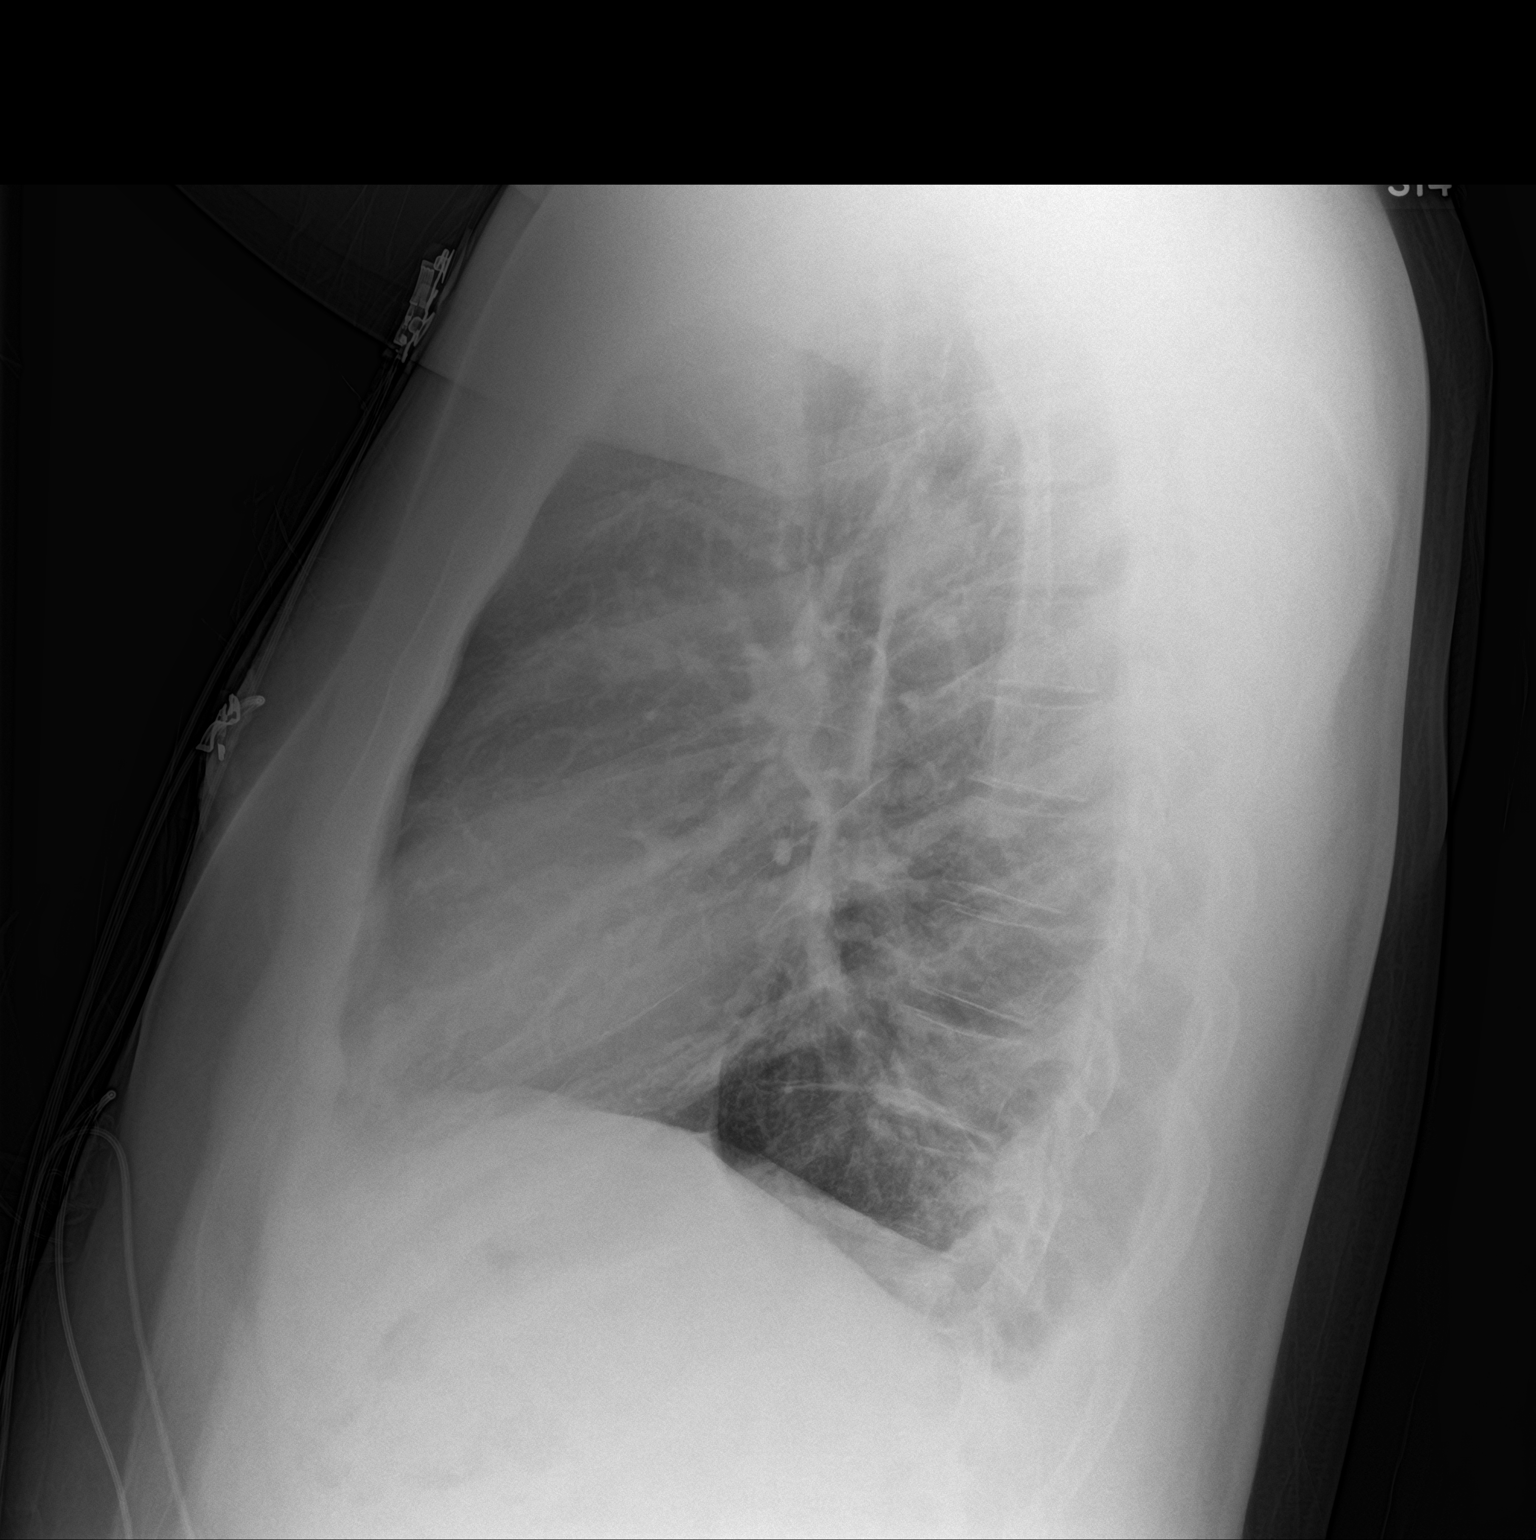

[2 of 2 positions shown; findings below may reference images not displayed]

FINDINGS: Stable mild cardiac enlargement. The vascular pattern is normal.
Mild subsegmental bibasilar atelectasis. No pleural effusion.
IMPRESSION: No significant acute findings.

## 2017-02-28 NOTE — Telephone Encounter (Signed)
Eber JonesCarolyn is not in the office today. Would you mind refilling this for patient? Last OV was 06/02/16 and next OV is 06/02/17.

## 2017-03-01 NOTE — Telephone Encounter (Signed)
Faxed printed/signed rx Vimpat to [facility] [address] at [phone]. Received fax confirmation..Marland Kitchen

## 2017-06-01 NOTE — Progress Notes (Signed)
[facility]  PATIENT: Calvin [name] DOB: Sep 18, 1980   REASON FOR VISIT: Follow-up for seizure disorder, pMercie Eonartial epilepsy HISTORY FROM: Patient    HISTORY OF PRESENT ILLNESS:UPDATE 1/25/2019CM Mr. Calvin [name], 37 year old male returns for follow-up with history of seizure disorder.  His last seizure occurred in January 2017 after missing doses of his Tegretol.  He tells me today he has not refilled his Vimpat since October however [facility] said he has not refilled it since July.  Patient has a history of noncompliance in the past.  He was encouraged to get both of his medications as ordered.  He returns for reevaluation    UPDATE 01/25/2018CM Mr. Calvin [name], 37 year old male returns for follow-up with history of complex partial seizure disorder with his last seizure occurring 05/27/2015 after missing doses of his Tegretol. He does have a history of noncompliance in the past. He tells me today that he was unable to get his Vimpat for the last 3 months according to the pharmacy because he needed a prior authorization. I see no communication of that in the chart. He has been off of his Vimpat since October just taking his carbamazepine. He was given refills  at his last appointment to last to this appointment. Fortunately he has not had any seizure activity. He returns for reevaluation   UPDATE 12/01/15 CM Mr. Calvin [name], 37 year old male returns for follow-up. He has a long-standing history of complex partial seizure disorder with his last seizure occurring 05/27/2015 after not taking his Tegretol and his level was less than 2. He has had a long history of noncompliance in the past. He denies missing any doses of his medications since last seen he was admitted to the hospital in March for cellulitis of the left leg and he has follow-up appointment with vascular physician. He returns for reevaluation   UPDATE 1/25/17CMMr. Calvin [name], 37 year-old male returns for  followup. He was last seen in this office 08/20/14.  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 He presented to the emergency room where he admitted to not taking his Tegretol and his level was less than 2. He was given a prescription and returned  the next day with another seizure at which time his carbamazepine level was 2.8 and he was started on Vimpat. He has a long history of noncompliance  [place] [employer] working with the [employer]. He needs labs to monitor adherence to his drug regimen. He is aware he cannot drive for 6 months .  HISTORY: Partial complex seizure disorder with last seizure occurring February 2013. Seizures in the past are due to noncompliance with his medications with seizure activity occurring. He was on Keppra and Depakote however he has been switched to carbamazepine by the emergency room in 2010. He does not have PCP.    REVIEW OF SYSTEMS: Full 14 system review of systems performed and notable only for those listed, all others are neg:  Constitutional: neg  Cardiovascular: neg Ear/Nose/Throat: neg  Skin: neg Eyes: neg Respiratory: neg Gastroitestinal: neg  Hematology/Lymphatic: neg  Endocrine: neg Musculoskeletal:neg Allergy/Immunology: neg Neurological: Seizure disorder Psychiatric: neg Sleep : neg   ALLERGIES: No Known Allergies  HOME MEDICATIONS: Outpatient Medications Prior to Visit  Medication Sig Dispense Refill  . carbamazepine (TEGRETOL) 200 MG tablet Take 1 tablet (200 mg total) by mouth 2 (two) times daily. 60 tablet 6  . VIMPAT 100  MG TABS TAKE 1 TABLET BY MOUTH TWICE A DAY (Patient not taking: Reported on 06/02/2017) 180 tablet 1  . dicyclomine (BENTYL) 20 MG tablet Take 1 tablet (20 mg total) by mouth 2 (two) times daily. 20 tablet 0  . ondansetron (ZOFRAN) 4 MG tablet Take 1 tablet (4 mg total) by mouth every 8 (eight) hours as needed for  nausea or vomiting. 12 tablet 0  . Phenylephrine-Pheniramine-DM (THERAFLU COLD & COUGH PO) Take 1 Applicatorful by mouth 2 (two) times daily as needed (cold).    . polyethylene glycol (MIRALAX / GLYCOLAX) packet Take 17 g by mouth daily as needed for mild constipation.     No facility-administered medications prior to visit.     PAST MEDICAL HISTORY: Past Medical History:  Diagnosis Date  . Cellulitis of left leg   . Hypertension   . Seizures (HCC)     PAST SURGICAL HISTORY: Past Surgical History:  Procedure Laterality Date  . TONSILLECTOMY      FAMILY HISTORY: Family History  Problem Relation Age of Onset  . Healthy Mother     SOCIAL HISTORY: Social History   Social History  . Marital status: Married    Spouse name: [name]  . Number of children: 0  . Years of education: BA   Occupational History  .  Unemployed    [employer]   Social History Main Topics  . Smoking status: Never Smoker  . Smokeless tobacco: Never Used  . Alcohol use No  . Drug use: No  . Sexual activity: Yes   Other Topics Concern  . Not on file   Social History Narrative   his is a 37 year old gentleman. He has never used caffeine, tobacco, alcohol or drugs. He has been married for 3 yrs and has one child. He lives at home with his wife.     PHYSICAL EXAM  Vitals:   06/02/17 0958  BP: (!) 143/84  Pulse: 83  Weight: 276 lb 3.2 oz (125.3 kg)   Body mass index is 34.52 kg/m. Generalized: Well developed, obese male in no acute distress   Neurological examination   Mentation: Alert oriented to time, place, history taking. Follows all commands speech and language fluent  Cranial nerve II-XII: Pupils were equal round reactive to light extraocular movements were full, visual field were full on confrontational test. Facial sensation and strength were normal. hearing was intact to finger rubbing bilaterally. Uvula tongue midline. head turning and shoulder shrug were normal and  symmetric.Tongue protrusion into cheek strength was normal. Motor: normal bulk and tone, full strength in the BUE, BLE, fine finger movements normal, no pronator drift. No focal weakness Coordination: finger-nose-finger, heel-to-shin bilaterally, no dysmetria Reflexes: 1+ upper lower and symmetric Gait and Station: Rising up from seated position without assistance, normal stance, moderate stride, good arm swing, smooth turning, able to perform tiptoe, and heel walking without difficulty. Tandem gait is steady    DIAGNOSTIC DATA (LABS, IMAGING, TESTING) - I reviewed patient records, labs, notes, testing and imaging myself where available.  Lab Results  Component Value Date   WBC 6.8 02/17/2017   HGB 14.3 02/17/2017   HCT 43.5 02/17/2017   MCV 84.8 02/17/2017   PLT 166 02/17/2017      Component Value Date/Time   NA 136 02/17/2017 0610   NA 140 06/02/2016 1052   K 3.7 02/17/2017 0610   CL 101 02/17/2017 0610   CO2 24 02/17/2017 0610   GLUCOSE 116 (H) 02/17/2017 1610  BUN 10 02/17/2017 0610   BUN 15 06/02/2016 1052   CREATININE 1.20 02/17/2017 0610   CALCIUM 8.4 (L) 02/17/2017 0610   PROT 7.2 02/17/2017 0610   PROT 7.2 06/02/2016 1052   ALBUMIN 3.9 02/17/2017 0610   ALBUMIN 4.1 06/02/2016 1052   AST 22 02/17/2017 0610   ALT 22 02/17/2017 0610   ALKPHOS 54 02/17/2017 0610   BILITOT 0.6 02/17/2017 0610   BILITOT <0.2 06/02/2016 1052   GFRNONAA >60 02/17/2017 0610   GFRAA >60 02/17/2017 0610    ASSESSMENT AND PLAN  37 y.o. year old male  has a past medical history of  Hypertension; and Seizures (HCC). here to follow-up . Last seizure 05/27/2015  occurred at home witnessed by his wife. Vimpat was started in addition to carbamazepine. He has a history of noncompliance in the past. He has not taken his V[facility]pat [facility]. The patient is a current patient of Dr. Pearlean Brownie  who is out of the office today . This note is sent to the work in doctor.     PLAN: Continue  Tegretol 200 mg twice daily  Restart Vimpat 100 twice daily RX is already at the pharmacy CBC, CMP CBZ level today Call for any seizure activity Follow-up in 1 year I spent 20 in total face to face time with the patient more than 50% of which was spent counseling and coordination of care, reviewing test results reviewing medications and discussing and reviewing the diagnosis of seizure disorder and importance of taking medications as prescribed. If you're unable to get  medication from the pharmacy  notify us by telephone. Patient was made aware that Vimpat is controlled substance and he can only get 5 refills with each prescription. Nilda Riggs, [facility], [facility], APRN  [facility] [address], [address] [place], [place] [address] [phone]

## 2017-06-02 ENCOUNTER — Ambulatory Visit: Payer: BLUE CROSS/BLUE SHIELD | Admitting: Nurse Practitioner

## 2017-06-02 ENCOUNTER — Encounter: Payer: Self-pay | Admitting: Nurse Practitioner

## 2017-06-02 VITALS — BP 143/84 | HR 83 | Wt 276.2 lb

## 2017-06-02 DIAGNOSIS — G40909 Epilepsy, unspecified, not intractable, without status epilepticus: Secondary | ICD-10-CM | POA: Diagnosis not present

## 2017-06-02 DIAGNOSIS — Z5181 Encounter for therapeutic drug level monitoring: Secondary | ICD-10-CM | POA: Diagnosis not present

## 2017-06-02 DIAGNOSIS — Z9114 Patient's other noncompliance with medication regimen: Secondary | ICD-10-CM | POA: Diagnosis not present

## 2017-06-02 DIAGNOSIS — Z91148 Patient's other noncompliance with medication regimen for other reason: Secondary | ICD-10-CM | POA: Insufficient documentation

## 2017-06-02 MED ORDER — CARBAMAZEPINE 200 MG PO TABS: 200.0000 mg | ORAL_TABLET | Freq: Two times a day (BID) | ORAL | 11 refills | Status: AC

## 2017-06-02 NOTE — Patient Instructions (Signed)
Continue Tegretol 200 mg twice daily  Restart Vimpat 100 twice daily CBC, CMP CBZ level today Call for any seizure activity Follow-up in 1 year

## 2017-06-03 LAB — COMPREHENSIVE METABOLIC PANEL
ALK PHOS: 65 IU/L (ref 39–117)
ALT: 34 IU/L (ref 0–44)
AST: 30 IU/L (ref 0–40)
Albumin/Globulin Ratio: 1.5 (ref 1.2–2.2)
Albumin: 4.4 g/dL (ref 3.5–5.5)
BUN/Creatinine Ratio: 13 (ref 9–20)
BUN: 13 mg/dL (ref 6–20)
CO2: 17 mmol/L — ABNORMAL LOW (ref 20–29)
Calcium: 9.2 mg/dL (ref 8.7–10.2)
Chloride: 106 mmol/L (ref 96–106)
Creatinine, Ser: 0.97 mg/dL (ref 0.76–1.27)
GFR calc Af Amer: 116 mL/min/{1.73_m2} (ref >59)
GFR calc non Af Amer: 100 mL/min/{1.73_m2} (ref >59)
GLOBULIN, TOTAL: 2.9 g/dL (ref 1.5–4.5)
Glucose: 102 mg/dL — ABNORMAL HIGH (ref 65–99)
Potassium: 5 mmol/L (ref 3.5–5.2)
SODIUM: 142 mmol/L (ref 134–144)
Total Protein: 7.3 g/dL (ref 6.0–8.5)

## 2017-06-03 LAB — CARBAMAZEPINE LEVEL, TOTAL: Carbamazepine (Tegretol), S: 5.3 ug/mL (ref 4.0–12.0)

## 2017-06-03 LAB — CBC WITH DIFFERENTIAL/PLATELET

## 2017-06-07 ENCOUNTER — Telehealth: Payer: Self-pay | Admitting: *Deleted

## 2017-06-07 NOTE — Telephone Encounter (Signed)
Attempted to reach patient. Did not LVM as DPR does not state message can be left. Will try to reach patient later.

## 2017-06-12 NOTE — Telephone Encounter (Signed)
Spoke with patient and informed him his labs are stable. Advised he continue taking Tegretol as prescribed. He verbalized understanding, appreciation.

## 2017-06-12 NOTE — Telephone Encounter (Signed)
Pt returned RN's call °

## 2017-06-12 NOTE — Telephone Encounter (Signed)
LVM requesting patient call back

## 2017-08-18 ENCOUNTER — Other Ambulatory Visit: Payer: Self-pay

## 2017-08-18 ENCOUNTER — Emergency Department (HOSPITAL_BASED_OUTPATIENT_CLINIC_OR_DEPARTMENT_OTHER)
Admit: 2017-08-18 | Discharge: 2017-08-18 | Disposition: A | Discharge status: Home / Self Care | Payer: BLUE CROSS/BLUE SHIELD | Attending: Emergency Medicine | Admitting: Emergency Medicine

## 2017-08-18 ENCOUNTER — Observation Stay (HOSPITAL_COMMUNITY)
Admission: EM | Admit: 2017-08-18 | Discharge: 2017-08-20 | Disposition: A | Discharge status: Home / Self Care | Payer: BLUE CROSS/BLUE SHIELD | Attending: Internal Medicine | Admitting: Internal Medicine

## 2017-08-18 ENCOUNTER — Encounter (HOSPITAL_COMMUNITY): Payer: Self-pay | Admitting: Emergency Medicine

## 2017-08-18 DIAGNOSIS — I10 Essential (primary) hypertension: Secondary | ICD-10-CM | POA: Diagnosis not present

## 2017-08-18 DIAGNOSIS — Z79899 Other long term (current) drug therapy: Secondary | ICD-10-CM | POA: Diagnosis not present

## 2017-08-18 DIAGNOSIS — E872 Acidosis, unspecified: Secondary | ICD-10-CM

## 2017-08-18 DIAGNOSIS — M79609 Pain in unspecified limb: Secondary | ICD-10-CM

## 2017-08-18 DIAGNOSIS — L03116 Cellulitis of left lower limb: Principal | ICD-10-CM | POA: Insufficient documentation

## 2017-08-18 DIAGNOSIS — G40909 Epilepsy, unspecified, not intractable, without status epilepticus: Secondary | ICD-10-CM | POA: Diagnosis not present

## 2017-08-18 DIAGNOSIS — I890 Lymphedema, not elsewhere classified: Secondary | ICD-10-CM | POA: Insufficient documentation

## 2017-08-18 DIAGNOSIS — M7989 Other specified soft tissue disorders: Secondary | ICD-10-CM

## 2017-08-18 DIAGNOSIS — L0390 Cellulitis, unspecified: Secondary | ICD-10-CM | POA: Diagnosis present

## 2017-08-18 LAB — CBC WITH DIFFERENTIAL/PLATELET
BASOS PCT: 0 %
Basophils Absolute: 0 10*3/uL (ref 0.0–0.1)
EOS ABS: 0 10*3/uL (ref 0.0–0.7)
EOS PCT: 0 %
HCT: 45.8 % (ref 39.0–52.0)
Hemoglobin: 15.2 g/dL (ref 13.0–17.0)
LYMPHS ABS: 1.6 10*3/uL (ref 0.7–4.0)
Lymphocytes Relative: 21 %
MCH: 28.3 pg (ref 26.0–34.0)
MCHC: 33.2 g/dL (ref 30.0–36.0)
MCV: 85.3 fL (ref 78.0–100.0)
Monocytes Absolute: 0.6 10*3/uL (ref 0.1–1.0)
Monocytes Relative: 8 %
Neutro Abs: 5.4 10*3/uL (ref 1.7–7.7)
Neutrophils Relative %: 71 %
PLATELETS: 151 10*3/uL (ref 150–400)
RBC: 5.37 MIL/uL (ref 4.22–5.81)
RDW: 13.1 % (ref 11.5–15.5)
WBC: 7.5 10*3/uL (ref 4.0–10.5)

## 2017-08-18 LAB — URINALYSIS, ROUTINE W REFLEX MICROSCOPIC
BILIRUBIN URINE: NEGATIVE
Glucose, UA: NEGATIVE mg/dL
HGB URINE DIPSTICK: NEGATIVE
Ketones, ur: NEGATIVE mg/dL
Leukocytes, UA: NEGATIVE
NITRITE: NEGATIVE
PROTEIN: NEGATIVE mg/dL
Specific Gravity, Urine: 1.02 (ref 1.005–1.030)
pH: 9 — ABNORMAL HIGH (ref 5.0–8.0)

## 2017-08-18 LAB — COMPREHENSIVE METABOLIC PANEL
ALK PHOS: 63 U/L (ref 38–126)
ALT: 19 U/L (ref 17–63)
AST: 22 U/L (ref 15–41)
Albumin: 4.4 g/dL (ref 3.5–5.0)
Anion gap: 10 (ref 5–15)
BILIRUBIN TOTAL: 0.7 mg/dL (ref 0.3–1.2)
BUN: 15 mg/dL (ref 6–20)
CALCIUM: 9.3 mg/dL (ref 8.9–10.3)
CHLORIDE: 103 mmol/L (ref 101–111)
CO2: 25 mmol/L (ref 22–32)
CREATININE: 1.12 mg/dL (ref 0.61–1.24)
Glucose, Bld: 97 mg/dL (ref 65–99)
Potassium: 4.2 mmol/L (ref 3.5–5.1)
Sodium: 138 mmol/L (ref 135–145)
Total Protein: 7.8 g/dL (ref 6.5–8.1)

## 2017-08-18 LAB — I-STAT CG4 LACTIC ACID, ED
LACTIC ACID, VENOUS: 4.61 mmol/L — AB (ref 0.5–1.9)
Lactic Acid, Venous: 0.78 mmol/L (ref 0.5–1.9)
Lactic Acid, Venous: 1.54 mmol/L (ref 0.5–1.9)
Lactic Acid, Venous: 2.3 mmol/L (ref 0.5–1.9)

## 2017-08-18 MED ORDER — IBUPROFEN 800 MG PO TABS
800.0000 mg | ORAL_TABLET | Freq: Once | ORAL | Status: AC
  Administered 2017-08-18: 800 mg via ORAL
  Filled 2017-08-18: qty 1

## 2017-08-18 MED ORDER — ONDANSETRON 4 MG PO TBDP
4.0000 mg | ORAL_TABLET | Freq: Once | ORAL | Status: AC
  Administered 2017-08-18: 4 mg via ORAL
  Filled 2017-08-18: qty 1

## 2017-08-18 MED ORDER — SODIUM CHLORIDE 0.9 % IV BOLUS
1000.0000 mL | Freq: Once | INTRAVENOUS | Status: AC
  Administered 2017-08-18: 1000 mL via INTRAVENOUS

## 2017-08-18 MED ORDER — ACETAMINOPHEN 325 MG PO TABS
650.0000 mg | ORAL_TABLET | Freq: Once | ORAL | Status: AC
  Administered 2017-08-18: 650 mg via ORAL
  Filled 2017-08-18: qty 2

## 2017-08-18 MED ORDER — ONDANSETRON HCL 4 MG/2ML IJ SOLN: 4.0000 mg | Freq: Once | INTRAMUSCULAR | Status: DC

## 2017-08-18 MED ORDER — VANCOMYCIN HCL IN DEXTROSE 1-5 GM/200ML-% IV SOLN
1000.0000 mg | Freq: Once | INTRAVENOUS | Status: AC
  Administered 2017-08-18: 1000 mg via INTRAVENOUS
  Filled 2017-08-18: qty 200

## 2017-08-18 MED ORDER — FENTANYL CITRATE (PF) 100 MCG/2ML IJ SOLN
50.0000 ug | Freq: Once | INTRAMUSCULAR | Status: AC
  Administered 2017-08-18: 50 ug via INTRAVENOUS
  Filled 2017-08-18: qty 2

## 2017-08-18 NOTE — ED Triage Notes (Signed)
Patient has history of recurrent cellulitis in left leg, states this morning he noticed a painful area that he believes is the beginning of another infection. Afebrile in triage. Alert and oriented at this time.

## 2017-08-18 NOTE — ED Provider Notes (Signed)
[facility] Provider Note   CSN: [id number] Arrival date & time: 08/18/17  1422     History   Chief Complaint Chief Complaint  Patient presents with  . Cellulitis    HPI Calvin [name] is a 37 y.o. male past medical history significant for epilepsy, hypertension, recurrent cellulitis of the left leg with lymphedema presenting with sudden onset painful, erythematous and swollen area to the left inner thigh.  States that his symptoms usually start with a sensation of swelling behind his knee coming up his thigh and he then experiences heat waves and fevers. He has not taken his temperature at home. He has not taken anything for his symptoms today.  He reports nausea but no vomiting. He explains that this is exactly how his symptoms start when he has an episode of cellulitis in his lower extremity.  HPI  Past Medical History:  Diagnosis Date  . Cellulitis of left leg   . Hypertension   . Seizures [facility](HCC)     Patient Active Problem List   Diagnosis Date Noted  . Noncompliance with medication regimen 06/02/2017  . Encounter for therapeutic drug monitoring 06/02/2016  . Lymphedema 12/04/2015  . Diarrhea 07/22/2015  . Cough 07/22/2015  . Sepsis due to cellulitis (HCC) 07/18/2015  . Cellulitis of left leg 07/18/2015  . Personal history of DVT (deep vein thrombosis) 07/18/2015  . Hypertension 10/25/2012  . 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 Seizure disorder (HCC) 03/06/2007  . DEEP VENOUS THROMBOPHLEBITIS, LEG, LEFT 02/23/2007    Past Surgical History:  Procedure Laterality Date  . TONSILLECTOMY          Home Medications    Prior to Admission medications   Medication Sig Start Date End Date Taking? Authorizing Provider  carbamazepine (TEGRETOL) 200 MG tablet Take 1 tablet (200 mg total) by mouth 2 (two) times daily. 06/02/17  Yes Nilda RiggsMartin, Nancy Carolyn, NP  VIMPAT 100 MG TABS TAKE 1 TABLET BY MOUTH  TWICE A DAY Patient not taking: Reported on 06/02/2017 02/28/17   York SpanielWillis, Charles K, MD    Family History Family History  Problem Relation Age of Onset  . Healthy Mother     Social History Social History   Tobacco Use  . Smoking status: Never Smoker  . Smokeless tobacco: Never Used  Substance Use Topics  . Alcohol use: No  . Drug use: No     Allergies   Patient has no known allergies.   Review of Systems Review of Systems  Constitutional: Positive for chills and fever.  HENT: Negative for congestion.   Eyes: Negative for pain and visual disturbance.  Respiratory: Negative for cough, chest tightness, wheezing and stridor.   Cardiovascular: Positive for leg swelling. Negative for chest pain and palpitations.  Gastrointestinal: Positive for nausea. Negative for abdominal distention, abdominal pain, diarrhea and vomiting.  Genitourinary: Negative for difficulty urinating, dysuria and hematuria.  Musculoskeletal: Positive for myalgias. Negative for arthralgias, back pain, joint swelling, neck pain and neck stiffness.  Skin: Positive for color change. Negative for pallor, rash and wound.  Neurological: Negative for dizziness, seizures, syncope, weakness, light-headedness, numbness and headaches.     Physical Exam Updated Vital Signs BP (!) 112/52   Pulse 99   Temp (!) 102 F (38.9 C) (Rectal)   Resp (!) 21   SpO2 93%   Physical Exam  Constitutional: He appears well-developed and well-nourished. No distress.  HENT:  Head: Normocephalic and atraumatic.  Eyes:  Conjunctivae and EOM are normal. Right eye exhibits no discharge. Left eye exhibits no discharge.  Neck: Normal range of motion. Neck supple.  Cardiovascular: Normal rate and regular rhythm.  No murmur heard. Pulmonary/Chest: Effort normal and breath sounds normal. No respiratory distress.  Abdominal: He exhibits no distension.  Musculoskeletal: Normal range of motion. He exhibits edema. He exhibits no  deformity.  1 + pitting edema to the left lower extremity.  He reports that he has this chronically at baseline but it appears worse today.  Neurological: He is alert.  Skin: Skin is warm and dry. No rash noted. He is not diaphoretic. There is erythema. No pallor.  Approximately 10 x 10 cm area of noncircumferential erythema, warmth and induration to the left inner thigh.  Psychiatric: He has a normal mood and affect.  Nursing note and vitals reviewed.    ED Treatments / Results  Labs (all labs ordered are listed, but only abnormal results are displayed) Labs Reviewed  URINALYSIS, ROUTINE W REFLEX MICROSCOPIC - Abnormal; Notable for the following components:      Result Value   pH 9.0 (*)    All other components within normal limits  I-STAT CG4 LACTIC ACID, ED - Abnormal; Notable for the following components:   Lactic Acid, Venous 2.30 (*)    All other components within normal limits  I-STAT CG4 LACTIC ACID, ED - Abnormal; Notable for the following components:   Lactic Acid, Venous 4.61 (*)    All other components within normal limits  CULTURE, BLOOD (ROUTINE X 2)  CULTURE, BLOOD (ROUTINE X 2)  COMPREHENSIVE METABOLIC PANEL  CBC WITH DIFFERENTIAL/PLATELET  I-STAT CG4 LACTIC ACID, ED  I-STAT CG4 LACTIC ACID, ED   No results found for: PROBNP   Lactic Acid, Venous  Date Value Ref Range Status  08/18/2017 1.54 0.5 - 1.9 mmol/L Final  08/18/2017 4.61 (HH) 0.5 - 1.9 mmol/L Final  08/18/2017 2.30 (HH) 0.5 - 1.9 mmol/L Final  07/20/2015 1.1 0.5 - 2.0 mmol/L Final   EKG None  Radiology No results found.  Procedures Procedures (including critical care time)  Medications Ordered in ED Medications  ondansetron (ZOFRAN) injection 4 mg (4 mg Intravenous Not Given 08/18/17 1834)  sodium chloride 0.9 % bolus 1,000 mL (0 mLs Intravenous Stopped 08/18/17 2051)  fentaNYL (SUBLIMAZE) injection 50 mcg (50 mcg Intravenous Given 08/18/17 1739)  vancomycin (VANCOCIN) IVPB 1000 mg/200 mL  premix (0 mg Intravenous Stopped 08/18/17 1949)  acetaminophen (TYLENOL) tablet 650 mg (650 mg Oral Given 08/18/17 1717)  ondansetron (ZOFRAN-ODT) disintegrating tablet 4 mg (4 mg Oral Given 08/18/17 1716)  sodium chloride 0.9 % bolus 1,000 mL (0 mLs Intravenous Stopped 08/18/17 2051)  ibuprofen (ADVIL,MOTRIN) tablet 800 mg (800 mg Oral Given 08/18/17 2008)     Initial Impression / Assessment and Plan / ED Course  I have reviewed the triage vital signs and the nursing notes.  Pertinent labs & imaging results that were available during my care of the patient were reviewed by me and considered in my medical decision making (see chart for details).    Patient presenting with concerns for recurrent cellulitis of the left lower extremity. DVT study was ordered from triage  Patient was initially afebrile and arrival on exam he is febrile at 101.4 and elevated lactate at 2.3.  Patient has not taken any antipyretics today.  Started IV fluids, analgesia, abx and antiemetic and will reassess  DVT study: negative for DVT or baker's cyst. Patient is well-appearing and non-toxic, no  tachycardia or respiratory distress.  On reassessment, patient reported significant improvement in his pain and nausea, he was resting comfortably.  Patient's temperature rising despite antipyretics and lactate increased from 2.3 to 4.61 in the ED. Persistent fever and tachycardia. Patient was given IV fluids and IV vanc. Rectal temp 102  He will require admission for IV antibiotics and monitoring. Patient has a history of sepsis from LE cellulitis. Discussed with Dr. Rubin Payor who agrees with assessment and plan.  Transferred patient care at end of shift to me Lilian Kapur, PA-C pending returned phone call for admission.  Discussed plan with patient and he was comfortable and stable at time of transfer.  Final Clinical Impressions(s) / ED Diagnoses   Final diagnoses:  Left leg cellulitis  Lactic acidosis    ED  Discharge Orders    None       Gregary Cromer 08/18/17 2325    Benjiman Core, MD 08/23/17 [phone]

## 2017-08-18 NOTE — ED Provider Notes (Signed)
Patient placed in Quick Look pathway, seen and evaluated   Chief Complaint: Left leg swelling and pain  HPI:   37 year old male presents with swelling in the left lower extremity and associated pain for 1 day.  Past medical history significant for seizures, hx of left leg cellulitis, chronic leg extremity lymphedema.  States his symptoms started about 8 AM this morning.  He reports associated chills and weakness.  He has been hospitalized for cellulitis in the past in the same leg.  He has chronic left leg swelling but it is worse. He denies chest pain, shortness of breath, nausea or vomiting.  ROS: +leg swelling, leg pain, chills, weakness  -fever  Physical Exam:   Gen: No distress  Neuro: Awake and Alert  Skin: Warm    Focused Exam: Left leg is has significant edema to the knee compared to the right. No redness or warmth.    Initiation of care has begun. The patient has been counseled on the process, plan, and necessity for staying for the completion/evaluation, and the remainder of the medical screening examination    Bethel BornGekas, Ashely Goosby Marie, PA-C 08/18/17 1517    Benjiman CorePickering, Nathan, MD 08/18/17 2215

## 2017-08-18 NOTE — Progress Notes (Signed)
*  Preliminary Results* Left lower extremity venous duplex completed. Left lower extremity is negative for deep vein thrombosis. There is no evidence of left Baker's cyst.  There is a heterogenous area of the left groin, suggestive of prominent inguinal lymph node.  08/18/2017 4:20 PM  Gertie FeyMichelle Tramane Gorum, BS, RVT, RDCS, RDMS

## 2017-08-19 ENCOUNTER — Encounter (HOSPITAL_COMMUNITY): Payer: Self-pay

## 2017-08-19 ENCOUNTER — Other Ambulatory Visit: Payer: Self-pay

## 2017-08-19 DIAGNOSIS — L0390 Cellulitis, unspecified: Secondary | ICD-10-CM | POA: Diagnosis not present

## 2017-08-19 DIAGNOSIS — R569 Unspecified convulsions: Secondary | ICD-10-CM

## 2017-08-19 LAB — CBC
HCT: 39.9 % (ref 39.0–52.0)
HCT: 42.3 % (ref 39.0–52.0)
Hemoglobin: 13.4 g/dL (ref 13.0–17.0)
Hemoglobin: 14.1 g/dL (ref 13.0–17.0)
MCH: 28.7 pg (ref 26.0–34.0)
MCH: 28.7 pg (ref 26.0–34.0)
MCHC: 33.6 g/dL (ref 30.0–36.0)
MCHC: 33.3 g/dL (ref 30.0–36.0)
MCV: 85.4 fL (ref 78.0–100.0)
MCV: 86 fL (ref 78.0–100.0)
PLATELETS: 138 10*3/uL — AB (ref 150–400)
PLATELETS: 139 10*3/uL — AB (ref 150–400)
RBC: 4.67 MIL/uL (ref 4.22–5.81)
RBC: 4.92 MIL/uL (ref 4.22–5.81)
RDW: 13.2 % (ref 11.5–15.5)
RDW: 13.2 % (ref 11.5–15.5)
WBC: 6.3 10*3/uL (ref 4.0–10.5)
WBC: 7 10*3/uL (ref 4.0–10.5)

## 2017-08-19 LAB — BASIC METABOLIC PANEL
Anion gap: 9 (ref 5–15)
BUN: 14 mg/dL (ref 6–20)
CO2: 23 mmol/L (ref 22–32)
CREATININE: 1.3 mg/dL — AB (ref 0.61–1.24)
Calcium: 8.2 mg/dL — ABNORMAL LOW (ref 8.9–10.3)
Chloride: 104 mmol/L (ref 101–111)
GFR calc Af Amer: >60 mL/min (ref >60)
GLUCOSE: 91 mg/dL (ref 65–99)
Potassium: 3.6 mmol/L (ref 3.5–5.1)
SODIUM: 136 mmol/L (ref 135–145)

## 2017-08-19 LAB — CREATININE, SERUM
CREATININE: 1.31 mg/dL — AB (ref 0.61–1.24)
GFR calc non Af Amer: >60 mL/min (ref >60)

## 2017-08-19 LAB — HIV ANTIBODY (ROUTINE TESTING W REFLEX): HIV SCREEN 4TH GENERATION: NONREACTIVE

## 2017-08-19 MED ORDER — KETOROLAC TROMETHAMINE 15 MG/ML IJ SOLN: 15.0000 mg | Freq: Four times a day (QID) | INTRAMUSCULAR | Status: DC | PRN

## 2017-08-19 MED ORDER — ACETAMINOPHEN 650 MG RE SUPP: 650.0000 mg | Freq: Four times a day (QID) | RECTAL | Status: DC | PRN

## 2017-08-19 MED ORDER — ACETAMINOPHEN 325 MG PO TABS
650.0000 mg | ORAL_TABLET | Freq: Four times a day (QID) | ORAL | Status: DC | PRN
  Administered 2017-08-19: 650 mg via ORAL
  Filled 2017-08-19: qty 2

## 2017-08-19 MED ORDER — HYDRALAZINE HCL 20 MG/ML IJ SOLN: 10.0000 mg | Freq: Three times a day (TID) | INTRAMUSCULAR | Status: DC | PRN

## 2017-08-19 MED ORDER — ENOXAPARIN SODIUM 40 MG/0.4ML ~~LOC~~ SOLN: 40.0000 mg | Freq: Every day | SUBCUTANEOUS | Status: DC

## 2017-08-19 MED ORDER — ONDANSETRON HCL 4 MG/2ML IJ SOLN: 4.0000 mg | Freq: Four times a day (QID) | INTRAMUSCULAR | Status: DC | PRN

## 2017-08-19 MED ORDER — LORAZEPAM 2 MG/ML IJ SOLN: 1.0000 mg | Freq: Four times a day (QID) | INTRAMUSCULAR | Status: DC | PRN

## 2017-08-19 MED ORDER — ENOXAPARIN SODIUM 60 MG/0.6ML ~~LOC~~ SOLN
60.0000 mg | Freq: Every day | SUBCUTANEOUS | Status: DC
  Administered 2017-08-19 – 2017-08-20 (×2): 60 mg via SUBCUTANEOUS
  Filled 2017-08-19 (×2): qty 0.6

## 2017-08-19 MED ORDER — ALBUTEROL SULFATE (2.5 MG/3ML) 0.083% IN NEBU: 2.5000 mg | INHALATION_SOLUTION | RESPIRATORY_TRACT | Status: DC | PRN

## 2017-08-19 MED ORDER — CARBAMAZEPINE 200 MG PO TABS
200.0000 mg | ORAL_TABLET | Freq: Two times a day (BID) | ORAL | Status: DC
  Administered 2017-08-19 – 2017-08-20 (×3): 200 mg via ORAL
  Filled 2017-08-19 (×4): qty 1

## 2017-08-19 MED ORDER — VANCOMYCIN HCL 10 G IV SOLR
1250.0000 mg | Freq: Three times a day (TID) | INTRAVENOUS | Status: DC
  Administered 2017-08-19 – 2017-08-20 (×4): 1250 mg via INTRAVENOUS
  Filled 2017-08-19 (×5): qty 1250

## 2017-08-19 MED ORDER — ONDANSETRON HCL 4 MG PO TABS: 4.0000 mg | ORAL_TABLET | Freq: Four times a day (QID) | ORAL | Status: DC | PRN

## 2017-08-19 NOTE — Progress Notes (Signed)
Pharmacy Antibiotic Note  Calvin [name] is a 37 y.o. male admitted on 08/18/2017 with LLE swelling/cellulitis.  Pharmacy has been consulted for Vancomycin  Dosing.  Vancomycin 1 g IV given in ED at  1820  Plan: Vancomycin 1250 mg IV q8h  Height: 6\' 3"  (190.5 cm) Weight: 275 lb 9.2 oz (125 kg) IBW/kg (Calculated) : 84.5  Temp (24hrs), Avg:100.5 F (38.1 C), Min:97.8 F (36.6 C), Max:102.9 F (39.4 C)  Recent Labs  Lab 08/18/17 1505 08/18/17 1548 08/18/17 1730 08/18/17 2023 08/18/17 2337 08/19/17 0151 08/19/17 0401  WBC 7.5  --   --   --   --  6.3 7.0  CREATININE 1.12  --   --   --   --  1.31* 1.30*  LATICACIDVEN  --  2.30* 4.61* 1.54 0.78  --   --     Estimated Creatinine Clearance: 111.9 mL/min (A) (by C-G formula based on SCr of 1.3 mg/dL (H)).    No Known Allergies    Eddie Candlebbott, Shariah Assad Vernon 08/19/2017 5:59 AM

## 2017-08-19 NOTE — ED Notes (Signed)
Requested hospital bed from [facility] 
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
## 2017-08-19 NOTE — H&P (Signed)
[facility] History and Physical  [name] ZOX:[id number] DOB: August 25, 1980 DOA: 08/18/2017  Referring physician:  PCP: [facility]   Chief Complaint: "My leg has done this before."    HPI: Calvin [name] is a 37 y.o. male With past medical history significant for hypertension and seizures presents the emergency room with a chief complaint of lower extremity pain.  Patient denies any leg trauma.  States he has a lesser lower leg before.  Sent that it swelled to the point that he could not ambulate well.  No recent antibiotics.  Denies any trauma.  ED course: Labs drawn started on sepsis protocol.  Hospitalist consulted for admission.   Review of Systems:  As per HPI otherwise 10 point review of systems negative.    Past Medical History:  Diagnosis Date  . Cellulitis of left leg   . Hypertension   . Seizures (HCC)    Past Surgical History:  Procedure Laterality Date  . TONSILLECTOMY     Social History:  reports that he has never smoked. He has never used smokeless tobacco. He reports that he does not drink alcohol or use drugs.  No Known Allergies  Family History  Problem Relation Age of Onset  . Healthy Mother      Prior to Admission medications   Medication Sig Start Date End Date Taking? Authorizing Provider  carbamazepine (TEGRETOL) 200 MG tablet Take 1 tablet (200 mg total) by mouth 2 (two) times daily. 06/02/17  Yes Nilda Riggs, NP  VIMPAT 100 MG TABS TAKE 1 TABLET BY MOUTH TWICE A DAY Patient not taking: Reported on 06/02/2017 02/28/17   York Spaniel, MD   Physical Exam: Vitals:   08/18/17 2300 08/18/17 2315 08/18/17 2330 08/19/17 0031  BP: 102/71 106/66 109/61 106/72  Pulse: 90 87 87 82  Resp: (!) 22 18 18 14   Temp:    97.8 F (36.6 C)  TempSrc:    Oral  SpO2: 97% 92% 93% 100%    Wt Readings from Last 3 Encounters:  06/02/17 125.3 kg (276 lb 3.2 oz)  06/02/16 118.5 kg (261 lb 3.2 oz)  02/17/16 115.7 kg (255 lb)     General:  Appears calm and comfortable; a&Ox3 Eyes:  PERRL, EOMI, normal lids, iris ENT:  grossly normal hearing, lips & tongue Neck:  no LAD, masses or thyromegaly Cardiovascular:  RRR, no m/r/g. No LE edema.  Respiratory:  CTA bilaterally, no w/r/r. Normal respiratory effort. Abdomen:  soft, ntnd Skin:  no rash or induration seen on limited exam. LLE swelling, induration and warmth, w/ some discoloration. Musculoskeletal:  grossly normal tone BUE/BLE Psychiatric: grossly normal mood and affect, speech fluent and appropriate Neurologic:  CN 2-12 grossly intact, moves all extremities in coordinated fashion.          Labs on Admission:  Basic Metabolic Panel: Recent Labs  Lab 08/18/17 1505  NA 138  K 4.2  CL 103  CO2 25  GLUCOSE 97  BUN 15  CREATININE 1.12  CALCIUM 9.3   Liver Function Tests: Recent Labs  Lab 08/18/17 1505  AST 22  ALT 19  ALKPHOS 63  BILITOT 0.7  PROT 7.8  ALBUMIN 4.4   No results for input(s): LIPASE, AMYLASE in the last 168 hours. No results for input(s): AMMONIA in the last 168 hours. CBC: Recent Labs  Lab 08/18/17 1505  WBC 7.5  NEUTROABS 5.4  HGB 15.2  HCT 45.8  MCV 85.3  PLT 151   Cardiac  Enzymes: No results for input(s): CKTOTAL, CKMB, CKMBINDEX, TROPONINI in the last 168 hours.  BNP (last 3 results) No results for input(s): BNP in the last 8760 hours.  ProBNP (last 3 results) No results for input(s): PROBNP in the last 8760 hours.   Creatinine clearance cannot be calculated (Unknown ideal weight.)  CBG: No results for input(s): GLUCAP in the last 168 hours.  Radiological Exams on Admission: No results found.  EKG: Independently reviewed. Sinus tachycardia. No stemi.  Assessment/Plan Active Problems:   Cellulitis  Sepsis 2/2 Cellulitis Patient hemodynamically stable Given Vanc emergency room, will continue Urine culture pending Blood cultures 2 pending Patient given 2000 mL of fluid in the emergency  room Lactic acid normal Lipase normal UA neg  Seizures hx Prn ativan Seizure precautions Continue tegretol  Hypertension When necessary hydralazine 10 mg IV as needed for severe blood pressure   Code Status: FC  DVT Prophylaxis: lovenox Family Communication: noen available Disposition Plan: Pending Improvement  Status: tele obs  Haydee SalterPhillip M Daltin Crist, MD Family Medicine [facility] [email or web address] Password [email or web address]

## 2017-08-19 NOTE — ED Notes (Signed)
Pt. Ambulated to bathroom.

## 2017-08-20 DIAGNOSIS — L03116 Cellulitis of left lower limb: Secondary | ICD-10-CM | POA: Diagnosis not present

## 2017-08-20 LAB — BASIC METABOLIC PANEL
ANION GAP: 8 (ref 5–15)
BUN: 10 mg/dL (ref 6–20)
CO2: 24 mmol/L (ref 22–32)
CREATININE: 1.16 mg/dL (ref 0.61–1.24)
Calcium: 8.1 mg/dL — ABNORMAL LOW (ref 8.9–10.3)
Chloride: 106 mmol/L (ref 101–111)
GLUCOSE: 107 mg/dL — AB (ref 65–99)
Potassium: 3.8 mmol/L (ref 3.5–5.1)
Sodium: 138 mmol/L (ref 135–145)

## 2017-08-20 MED ORDER — SACCHAROMYCES BOULARDII 250 MG PO CAPS: 250.0000 mg | ORAL_CAPSULE | Freq: Two times a day (BID) | ORAL | 0 refills | Status: AC

## 2017-08-20 MED ORDER — DOXYCYCLINE HYCLATE 100 MG PO TABS: 100.0000 mg | ORAL_TABLET | Freq: Two times a day (BID) | ORAL | 0 refills | Status: AC

## 2017-08-20 MED ORDER — DOXYCYCLINE HYCLATE 100 MG PO TABS
100.0000 mg | ORAL_TABLET | Freq: Two times a day (BID) | ORAL | Status: DC
  Administered 2017-08-20: 100 mg via ORAL
  Filled 2017-08-20: qty 1

## 2017-08-20 NOTE — Progress Notes (Signed)
Discharge instructions given and all questions were answered.  

## 2017-09-18 NOTE — Discharge Summary (Signed)
[facility] Discharge Summary   Patient: Calvin [name] CWC:[id number]   PCP: [facility] DOB: [birth year]   Date of admission: 08/18/2017   Date of discharge: 08/20/2017    Discharge Diagnoses:  Active Problems:   Cellulitis   Admitted From: home Disposition:  home  Recommendations for Outpatient Follow-up:  1. Follow-up with PCP in 1 week.  Follow-up Information    [facility]. Schedule an appointment as soon as possible for a visit in 2 weeks.   Why:  Patient will call Contact information: [address] [place] [facility] [place] [address] [phone]          Diet recommendation: regular diet  Activity: The patient is advised to gradually reintroduce usual activities.  Discharge Condition: good  Code Status: full code  History of present illness: As per the H and P dictated on admission, "Calvin [name] is a 37 y.o. male With past medical history significant for hypertension and seizures presents the emergency room with a chief complaint of lower extremity pain.  Patient denies any leg trauma.  States he has a lesser lower leg before.  Sent that it swelled to the point that he could not ambulate well.  No recent antibiotics.  Denies any trauma."  Hospital Course:  Summary of his active problems in the hospital is as following. Cellulitis Sepsis ruled out. Patient hemodynamically stable Given Vanc, transition to oral doxycycline. No blood cultures but patient does not appear septic on my examination.   Seizures hx Continue tegretol  Hypertension Blood pressure stable.  Monitor as an outpatient  All other chronic medical condition were stable during the hospitalization.  which was arranged by Child psychotherapist and case Production designer, theatre/television/film. On the day of the discharge the patient's vitals were stable , and no other acute medical condition were reported by patient. the patient was felt safe to be discharge at home with family.  Consultants:  none Procedures: none  DISCHARGE MEDICATION: Allergies as of 08/20/2017   No Known Allergies     Medication List    TAKE these medications   carbamazepine 200 MG tablet Commonly known as:  TEGRETOL Take 1 tablet (200 mg total) by mouth 2 (two) times daily.   saccharomyces boulardii 250 MG capsule Commonly known as:  FLORASTOR Take 1 capsule (250 mg total) by mouth 2 (two) times daily.   VIMPAT 100 MG Tabs Generic drug:  Lacosamide TAKE 1 TABLET BY MOUTH TWICE A DAY     ASK your doctor about these medications   doxycycline 100 MG tablet Commonly known as:  VIBRA-TABS Take 1 tablet (100 mg total) by mouth 2 (two) times daily for 5 days. Ask about: Should I take this medication?      No Known Allergies Discharge Instructions    Diet - low sodium heart healthy   Complete by:  As directed    Increase activity slowly   Complete by:  As directed      Discharge Exam: Filed Weights   08/19/17 0545 08/19/17 0614 08/20/17 0531  Weight: 125 kg (275 lb 9.2 oz) 122 kg (268 lb 14.4 oz) 121.7 kg (268 lb 4.8 oz)   Vitals:   08/19/17 2353 08/20/17 0531  BP: 110/67 124/68  Pulse: 96 92  Resp: 18 18  Temp: 99.6 F (37.6 C) 98.8 F (37.1 C)  SpO2: 99% 95%   General: Appear in no distress, no Rash; Oral Mucosa moist. Cardiovascular: S1 and S2 Present, no Murmur, no JVD Respiratory: Bilateral Air entry  present and Clear to Auscultation, no Crackles, no wheezes Abdomen: Bowel Sound present, Soft and no tenderness Extremities: bilateral Pedal edema, no calf tenderness Neurology: Grossly no focal neuro deficit.  The results of significant diagnostics from this hospitalization (including imaging, microbiology, ancillary and laboratory) are listed below for reference.    Time spent: 35 minutes  Signed:  Lynden Oxford  [facility] 08/20/2017  7:50 AM

## 2018-12-20 ENCOUNTER — Emergency Department (HOSPITAL_COMMUNITY)
Admission: EM | Admit: 2018-12-20 | Discharge: 2018-12-20 | Disposition: A | Discharge status: Home / Self Care | Payer: BC Managed Care – PPO | Attending: Emergency Medicine | Admitting: Emergency Medicine

## 2018-12-20 ENCOUNTER — Other Ambulatory Visit: Payer: Self-pay

## 2018-12-20 ENCOUNTER — Encounter (HOSPITAL_COMMUNITY): Payer: Self-pay | Admitting: Emergency Medicine

## 2018-12-20 DIAGNOSIS — M7989 Other specified soft tissue disorders: Secondary | ICD-10-CM | POA: Diagnosis present

## 2018-12-20 DIAGNOSIS — I10 Essential (primary) hypertension: Secondary | ICD-10-CM | POA: Diagnosis not present

## 2018-12-20 DIAGNOSIS — L03116 Cellulitis of left lower limb: Secondary | ICD-10-CM

## 2018-12-20 LAB — BASIC METABOLIC PANEL
Anion gap: 9 (ref 5–15)
BUN: 17 mg/dL (ref 6–20)
CO2: 26 mmol/L (ref 22–32)
Calcium: 9.1 mg/dL (ref 8.9–10.3)
Chloride: 104 mmol/L (ref 98–111)
Creatinine, Ser: 1.25 mg/dL — ABNORMAL HIGH (ref 0.61–1.24)
GFR calc Af Amer: >60 mL/min (ref >60)
GFR calc non Af Amer: >60 mL/min (ref >60)
Glucose, Bld: 108 mg/dL — ABNORMAL HIGH (ref 70–99)
Potassium: 5 mmol/L (ref 3.5–5.1)
Sodium: 139 mmol/L (ref 135–145)

## 2018-12-20 LAB — CBC WITH DIFFERENTIAL/PLATELET
Abs Immature Granulocytes: 0.06 10*3/uL (ref 0.00–0.07)
Basophils Absolute: 0 10*3/uL (ref 0.0–0.1)
Basophils Relative: 0 %
Eosinophils Absolute: 0 10*3/uL (ref 0.0–0.5)
Eosinophils Relative: 0 %
HCT: 47.4 % (ref 39.0–52.0)
Hemoglobin: 15.1 g/dL (ref 13.0–17.0)
Immature Granulocytes: 1 %
Lymphocytes Relative: 7 %
Lymphs Abs: 0.8 10*3/uL (ref 0.7–4.0)
MCH: 27.4 pg (ref 26.0–34.0)
MCHC: 31.9 g/dL (ref 30.0–36.0)
MCV: 86 fL (ref 80.0–100.0)
Monocytes Absolute: 0.7 10*3/uL (ref 0.1–1.0)
Monocytes Relative: 6 %
Neutro Abs: 9.7 10*3/uL — ABNORMAL HIGH (ref 1.7–7.7)
Neutrophils Relative %: 86 %
Platelets: 195 10*3/uL (ref 150–400)
RBC: 5.51 MIL/uL (ref 4.22–5.81)
RDW: 13.1 % (ref 11.5–15.5)
WBC: 11.3 10*3/uL — ABNORMAL HIGH (ref 4.0–10.5)
nRBC: 0 % (ref 0.0–0.2)

## 2018-12-20 MED ORDER — HYDROCODONE-ACETAMINOPHEN 5-325 MG PO TABS
1.0000 | ORAL_TABLET | Freq: Once | ORAL | Status: AC
  Administered 2018-12-20: 1 via ORAL
  Filled 2018-12-20: qty 1

## 2018-12-20 MED ORDER — DOXYCYCLINE HYCLATE 100 MG PO TABS
100.0000 mg | ORAL_TABLET | Freq: Once | ORAL | Status: AC
  Administered 2018-12-20: 06:00:00 100 mg via ORAL
  Filled 2018-12-20: qty 1

## 2018-12-20 MED ORDER — DOXYCYCLINE HYCLATE 100 MG PO CAPS
100.0000 mg | ORAL_CAPSULE | Freq: Two times a day (BID) | ORAL | 0 refills | Status: AC
Start: 2018-12-20 — End: ?

## 2018-12-20 NOTE — ED Notes (Signed)
Lt lower extremity swollen  No redness seen

## 2018-12-20 NOTE — ED Notes (Signed)
Pt verbalized understanding of d/c instructions and has no further questions, VSS, NAD. D/c home with family.

## 2018-12-20 NOTE — ED Notes (Signed)
The pt is c/o lt leg pain and swelling for 2 days

## 2018-12-20 NOTE — ED Notes (Signed)
The pt has vital signs

## 2018-12-20 NOTE — ED Triage Notes (Signed)
Pt c/o left leg swelling. Denies injury/trauma, hx cellulitis.

## 2018-12-20 NOTE — ED Provider Notes (Signed)
[facility] Provider Note   CSN: [id number] Arrival date & time: 12/20/18  0113     History   Chief Complaint Chief Complaint  Patient presents with  . Leg Swelling    HPI Calvin [name] is a 38 y.o. male.     The history is provided by the patient.  Leg Pain Location:  Leg Leg location:  L lower leg Pain details:    Quality:  Aching   Severity:  Moderate   Onset quality:  Gradual   Duration:  1 day   Timing:  Constant   Progression:  Worsening Chronicity:  New Relieved by:  Nothing Exacerbated by: movement. Associated symptoms: fever   Patient presents with left leg pain and concern for cellulitis.  Patient has long history of recurrent cellulitis in the left leg as well as chronic edema in the leg.  He reports this episode began over the past day.  He reports having fevers and chills.  He also reports vomiting.  No chest pain or shortness of breath.  He has had to be admitted previously for this  Past Medical History:  Diagnosis Date  . Cellulitis of left leg   . Hypertension   . Seizures [facility](HCC)     Patient Active Problem List   Diagnosis Date Noted  . Cellulitis 08/19/2017  . Noncompliance with medication regimen 06/02/2017  . Encounter for therapeutic drug monitoring 06/02/2016  . Lymphedema 12/04/2015  . Diarrhea 07/22/2015  . Cough 07/22/2015  . Sepsis due to cellulitis (HCC) 07/18/2015  . Cellulitis of left leg 07/18/2015  . Personal history of DVT (deep vein thrombosis) 07/18/2015  . Hypertension 10/25/2012  . 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 Seizure disorder (HCC) 03/06/2007  . DEEP VENOUS THROMBOPHLEBITIS, LEG, LEFT 02/23/2007    Past Surgical History:  Procedure Laterality Date  . NO PAST SURGERIES          Home Medications    Prior to Admission medications   Medication Sig Start Date End Date Taking? Authorizing Provider  carbamazepine (TEGRETOL) 200 MG  tablet Take 1 tablet (200 mg total) by mouth 2 (two) times daily. 06/02/17   Nilda RiggsMartin, Nancy Carolyn, NP  saccharomyces boulardii (FLORASTOR) 250 MG capsule Take 1 capsule (250 mg total) by mouth 2 (two) times daily. 08/20/17   Rolly SalterPatel, Pranav M, MD  VIMPAT 100 MG TABS TAKE 1 TABLET BY MOUTH TWICE A DAY Patient not taking: Reported on 06/02/2017 02/28/17   York SpanielWillis, Charles K, MD    Family History Family History  Problem Relation Age of Onset  . Healthy Mother     Social History Social History   Tobacco Use  . Smoking status: Never Smoker  . Smokeless tobacco: Never Used  Substance Use Topics  . Alcohol use: No  . Drug use: No     Allergies   Patient has no known allergies.   Review of Systems Review of Systems  Constitutional: Positive for fever.  Respiratory: Negative for shortness of breath.   Cardiovascular: Positive for leg swelling. Negative for chest pain.  Gastrointestinal: Positive for vomiting.  All other systems reviewed and are negative.    Physical Exam Updated Vital Signs BP 120/84   Pulse 97   Temp 98.2 F (36.8 C) (Oral)   Resp 18   SpO2 95%   Physical Exam CONSTITUTIONAL: Well developed/well nourished, resting comfortably HEAD: Normocephalic/atraumatic EYES: EOMI ENMT: Mask in place NECK: supple no meningeal signs SPINE/BACK:entire spine  nontender CV: S1/S2 noted, no murmurs/rubs/gallops noted LUNGS: Lungs are clear to auscultation bilaterally, no apparent distress ABDOMEN: soft, nontender NEURO: Pt is awake/alert/appropriate, moves all extremitiesx4.  No facial droop.   EXTREMITIES: pulses normal/equal, full ROM, see photo below.  Distal pulses equal and intact.  Lymphedema noted left lower extremity.  Diffuse tenderness about left calf and left thigh.  No significant erythema noted. SKIN: warm, color normal, no open wounds noted to the lower extremities PSYCH: no abnormalities of mood noted, alert and oriented to situation     ED Treatments /  Results  Labs (all labs ordered are listed, but only abnormal results are displayed) Labs Reviewed  CBC WITH DIFFERENTIAL/PLATELET - Abnormal; Notable for the following components:      Result Value   WBC 11.3 (*)    Neutro Abs 9.7 (*)    All other components within normal limits  BASIC METABOLIC PANEL - Abnormal; Notable for the following components:   Glucose, Bld 108 (*)    Creatinine, Ser 1.25 (*)    All other components within normal limits    EKG None  Radiology No results found.  Procedures Procedures   Medications Ordered in ED Medications  doxycycline (VIBRA-TABS) tablet 100 mg (100 mg Oral Given 12/20/18 0535)  HYDROcodone-acetaminophen (NORCO/VICODIN) 5-325 MG per tablet 1 tablet (1 tablet Oral Given 12/20/18 0535)     Initial Impression / Assessment and Plan / ED Course  I have reviewed the triage vital signs and the nursing notes.  Pertinent labs  results that were available during my care of the patient were reviewed by me and considered in my medical decision making (see chart for details).        5:41 AM Patient with history of chronic lymphedema of the lower extremity.  Per vascular notes from 2017, it is felt that this lymphedema is due to previous trauma.  Due to this lymphedema, he has recurrent cellulitis.  Patient was admitted in 2019 for significant cellulitis to left leg.  Patient reports the symptoms are similar to prior but are just beginning.  Currently his vital signs are appropriate.  His exam is more consistent with chronic lymphedema with potentially early cellulitis.  He has had previous negative DVT studies.  Plan will be to check his labs, start on doxycycline. 7:14 AM Patient feeling improved.  He feels this is a recurrence of cellulitis.  Labs overall reassuring.  No signs of sepsis.  Low suspicion for PE.  Will discharge home on doxycycline. Final Clinical Impressions(s) / ED Diagnoses   Final diagnoses:  Cellulitis of left lower  extremity    ED Discharge Orders    None       Ripley Fraise, MD 12/20/18 [phone]

## 2020-05-20 ENCOUNTER — Ambulatory Visit: Payer: 59 | Admitting: Podiatry

## 2021-05-18 DIAGNOSIS — G4733 Obstructive sleep apnea (adult) (pediatric): Secondary | ICD-10-CM | POA: Diagnosis not present

## 2021-05-27 DIAGNOSIS — G4733 Obstructive sleep apnea (adult) (pediatric): Secondary | ICD-10-CM | POA: Diagnosis not present

## 2021-06-27 DIAGNOSIS — G4733 Obstructive sleep apnea (adult) (pediatric): Secondary | ICD-10-CM | POA: Diagnosis not present

## 2021-07-25 DIAGNOSIS — G4733 Obstructive sleep apnea (adult) (pediatric): Secondary | ICD-10-CM | POA: Diagnosis not present

## 2021-08-05 DIAGNOSIS — G4719 Other hypersomnia: Secondary | ICD-10-CM | POA: Diagnosis not present

## 2021-08-05 DIAGNOSIS — G4733 Obstructive sleep apnea (adult) (pediatric): Secondary | ICD-10-CM | POA: Diagnosis not present

## 2021-08-18 DIAGNOSIS — R5383 Other fatigue: Secondary | ICD-10-CM | POA: Diagnosis not present

## 2021-08-25 DIAGNOSIS — G4733 Obstructive sleep apnea (adult) (pediatric): Secondary | ICD-10-CM | POA: Diagnosis not present

## 2021-09-24 DIAGNOSIS — G4733 Obstructive sleep apnea (adult) (pediatric): Secondary | ICD-10-CM | POA: Diagnosis not present

## 2021-10-23 DIAGNOSIS — G4733 Obstructive sleep apnea (adult) (pediatric): Secondary | ICD-10-CM | POA: Diagnosis not present

## 2021-10-23 DIAGNOSIS — G47419 Narcolepsy without cataplexy: Secondary | ICD-10-CM | POA: Diagnosis not present

## 2021-10-25 DIAGNOSIS — G4733 Obstructive sleep apnea (adult) (pediatric): Secondary | ICD-10-CM | POA: Diagnosis not present

## 2021-11-23 DIAGNOSIS — G47419 Narcolepsy without cataplexy: Secondary | ICD-10-CM | POA: Diagnosis not present

## 2021-11-24 DIAGNOSIS — G4733 Obstructive sleep apnea (adult) (pediatric): Secondary | ICD-10-CM | POA: Diagnosis not present

## 2021-12-21 DIAGNOSIS — G47419 Narcolepsy without cataplexy: Secondary | ICD-10-CM | POA: Diagnosis not present

## 2021-12-25 DIAGNOSIS — G4733 Obstructive sleep apnea (adult) (pediatric): Secondary | ICD-10-CM | POA: Diagnosis not present

## 2022-01-25 DIAGNOSIS — G4733 Obstructive sleep apnea (adult) (pediatric): Secondary | ICD-10-CM | POA: Diagnosis not present

## 2022-02-24 DIAGNOSIS — G4733 Obstructive sleep apnea (adult) (pediatric): Secondary | ICD-10-CM | POA: Diagnosis not present

## 2022-02-27 DIAGNOSIS — R079 Chest pain, unspecified: Secondary | ICD-10-CM | POA: Diagnosis not present

## 2022-02-27 DIAGNOSIS — R0781 Pleurodynia: Secondary | ICD-10-CM | POA: Diagnosis not present

## 2022-02-27 DIAGNOSIS — R0602 Shortness of breath: Secondary | ICD-10-CM | POA: Diagnosis not present

## 2022-06-14 DIAGNOSIS — O209 Hemorrhage in early pregnancy, unspecified: Secondary | ICD-10-CM | POA: Diagnosis not present

## 2022-06-14 DIAGNOSIS — Z3A00 Weeks of gestation of pregnancy not specified: Secondary | ICD-10-CM | POA: Diagnosis not present

## 2022-06-14 DIAGNOSIS — O26891 Other specified pregnancy related conditions, first trimester: Secondary | ICD-10-CM | POA: Diagnosis not present

## 2022-09-01 DIAGNOSIS — G47419 Narcolepsy without cataplexy: Secondary | ICD-10-CM | POA: Diagnosis not present

## 2022-09-01 DIAGNOSIS — Z91199 Patient's noncompliance with other medical treatment and regimen due to unspecified reason: Secondary | ICD-10-CM | POA: Diagnosis not present

## 2022-09-01 DIAGNOSIS — E669 Obesity, unspecified: Secondary | ICD-10-CM | POA: Diagnosis not present

## 2022-09-01 DIAGNOSIS — Z8249 Family history of ischemic heart disease and other diseases of the circulatory system: Secondary | ICD-10-CM | POA: Diagnosis not present

## 2022-09-01 DIAGNOSIS — Z87891 Personal history of nicotine dependence: Secondary | ICD-10-CM | POA: Diagnosis not present

## 2022-09-01 DIAGNOSIS — I10 Essential (primary) hypertension: Secondary | ICD-10-CM | POA: Diagnosis not present

## 2022-09-01 DIAGNOSIS — Z833 Family history of diabetes mellitus: Secondary | ICD-10-CM | POA: Diagnosis not present

## 2022-09-01 DIAGNOSIS — Z809 Family history of malignant neoplasm, unspecified: Secondary | ICD-10-CM | POA: Diagnosis not present

## 2022-09-01 DIAGNOSIS — Z6835 Body mass index (BMI) 35.0-35.9, adult: Secondary | ICD-10-CM | POA: Diagnosis not present

## 2023-04-10 DIAGNOSIS — G47419 Narcolepsy without cataplexy: Secondary | ICD-10-CM | POA: Diagnosis not present

## 2023-04-10 DIAGNOSIS — F331 Major depressive disorder, recurrent, moderate: Secondary | ICD-10-CM | POA: Diagnosis not present

## 2023-04-10 DIAGNOSIS — G40909 Epilepsy, unspecified, not intractable, without status epilepticus: Secondary | ICD-10-CM | POA: Diagnosis not present

## 2023-04-10 DIAGNOSIS — I10 Essential (primary) hypertension: Secondary | ICD-10-CM | POA: Diagnosis not present

## 2023-05-25 DIAGNOSIS — Z131 Encounter for screening for diabetes mellitus: Secondary | ICD-10-CM | POA: Diagnosis not present

## 2023-05-25 DIAGNOSIS — G40909 Epilepsy, unspecified, not intractable, without status epilepticus: Secondary | ICD-10-CM | POA: Diagnosis not present

## 2023-05-25 DIAGNOSIS — Z0000 Encounter for general adult medical examination without abnormal findings: Secondary | ICD-10-CM | POA: Diagnosis not present

## 2023-05-25 DIAGNOSIS — Z13220 Encounter for screening for lipoid disorders: Secondary | ICD-10-CM | POA: Diagnosis not present

## 2023-05-25 DIAGNOSIS — E559 Vitamin D deficiency, unspecified: Secondary | ICD-10-CM | POA: Diagnosis not present

## 2023-05-25 DIAGNOSIS — G47419 Narcolepsy without cataplexy: Secondary | ICD-10-CM | POA: Diagnosis not present

## 2023-05-25 DIAGNOSIS — Z1389 Encounter for screening for other disorder: Secondary | ICD-10-CM | POA: Diagnosis not present

## 2023-11-23 ENCOUNTER — Encounter (HOSPITAL_COMMUNITY): Payer: Self-pay | Admitting: Emergency Medicine

## 2023-11-23 ENCOUNTER — Other Ambulatory Visit: Payer: Self-pay

## 2023-11-23 ENCOUNTER — Emergency Department (HOSPITAL_COMMUNITY)
Admission: EM | Admit: 2023-11-23 | Discharge: 2023-11-24 | Disposition: A | Discharge status: Home / Self Care | Attending: Emergency Medicine | Admitting: Emergency Medicine

## 2023-11-23 DIAGNOSIS — L0390 Cellulitis, unspecified: Secondary | ICD-10-CM

## 2023-11-23 DIAGNOSIS — L03116 Cellulitis of left lower limb: Secondary | ICD-10-CM | POA: Insufficient documentation

## 2023-11-23 LAB — BASIC METABOLIC PANEL WITH GFR
Anion gap: 7 (ref 5–15)
BUN: 25 mg/dL — ABNORMAL HIGH (ref 6–20)
CO2: 25 mmol/L (ref 22–32)
Calcium: 8.8 mg/dL — ABNORMAL LOW (ref 8.9–10.3)
Chloride: 108 mmol/L (ref 98–111)
Creatinine, Ser: 1.14 mg/dL (ref 0.61–1.24)
GFR, Estimated: >60 mL/min (ref >60)
Glucose, Bld: 104 mg/dL — ABNORMAL HIGH (ref 70–99)
Potassium: 4.2 mmol/L (ref 3.5–5.1)
Sodium: 140 mmol/L (ref 135–145)

## 2023-11-23 NOTE — ED Triage Notes (Signed)
 Pt presents with significant swelling to left leg.  Pt reports remote hx of cellulitis to this leg.  Pt is noted to be hypertensive in triage.  Reported he was supposed to begin taking medication but was trying to manage with exercise and diet.

## 2023-11-24 LAB — CBC WITH DIFFERENTIAL/PLATELET
Abs Immature Granulocytes: 0.03 K/uL (ref 0.00–0.07)
Basophils Absolute: 0 K/uL (ref 0.0–0.1)
Basophils Relative: 0 %
Eosinophils Absolute: 0 K/uL (ref 0.0–0.5)
Eosinophils Relative: 0 %
HCT: 44.9 % (ref 39.0–52.0)
Hemoglobin: 14.1 g/dL (ref 13.0–17.0)
Immature Granulocytes: 0 %
Lymphocytes Relative: 14 %
Lymphs Abs: 1.4 K/uL (ref 0.7–4.0)
MCH: 27.4 pg (ref 26.0–34.0)
MCHC: 31.4 g/dL (ref 30.0–36.0)
MCV: 87.2 fL (ref 80.0–100.0)
Monocytes Absolute: 0.5 K/uL (ref 0.1–1.0)
Monocytes Relative: 5 %
Neutro Abs: 7.9 K/uL — ABNORMAL HIGH (ref 1.7–7.7)
Neutrophils Relative %: 81 %
Platelets: 198 K/uL (ref 150–400)
RBC: 5.15 MIL/uL (ref 4.22–5.81)
RDW: 13.7 % (ref 11.5–15.5)
WBC: 9.8 K/uL (ref 4.0–10.5)
nRBC: 0 % (ref 0.0–0.2)

## 2023-11-24 MED ORDER — DOXYCYCLINE HYCLATE 100 MG PO CAPS: 100.0000 mg | ORAL_CAPSULE | Freq: Two times a day (BID) | ORAL | 0 refills | Status: AC

## 2023-11-24 MED ORDER — DOXYCYCLINE HYCLATE 100 MG PO TABS
100.0000 mg | ORAL_TABLET | Freq: Once | ORAL | Status: AC
  Administered 2023-11-24: 100 mg via ORAL
  Filled 2023-11-24: qty 1

## 2023-11-24 MED ORDER — ENOXAPARIN SODIUM 120 MG/0.8ML IJ SOSY
120.0000 mg | PREFILLED_SYRINGE | Freq: Once | INTRAMUSCULAR | Status: AC
  Administered 2023-11-24: 120 mg via SUBCUTANEOUS
  Filled 2023-11-24: qty 0.8

## 2023-11-24 NOTE — ED Provider Notes (Signed)
 [place] EMERGENCY DEPARTMENT AT [facility] Provider Note   CSN: [id number] Arrival date & time: 11/23/23  2235     Patient presents with: Leg Swelling   Calvin [name] is a 43 y.o. male.   The history is provided by the patient.  Leg Pain Location:  Leg Leg location:  L lower leg Pain details:    Radiates to:  Does not radiate   Severity:  Moderate   Duration: hours.   Timing:  Constant   Progression:  Unchanged Chronicity:  Recurrent Prior injury to area:  No Relieved by:  Nothing Worsened by:  Nothing Ineffective treatments:  None tried Associated symptoms: no back pain and no fever   Risk factors: no concern for non-accidental trauma   Patient is concerned for cellulitis L>r swelling.       Prior to Admission medications   Medication Sig Start Date End Date Taking? Authorizing Provider  doxycycline  (VIBRAMYCIN ) 100 MG capsule Take 1 capsule (100 mg total) by mouth 2 (two) times daily. One po bid x 7 days 11/24/23  Yes Levander Katzenstein, MD  carbamazepine  (TEGRETOL ) 200 MG tablet Take 1 tablet (200 mg total) by mouth 2 (two) times daily. Patient not taking: Reported on 12/20/2018 06/02/17   Gladis Inocente Coy, NP  doxycycline  (VIBRAMYCIN ) 100 MG capsule Take 1 capsule (100 mg total) by mouth 2 (two) times daily. One po bid x 7 days 12/20/18   Midge Golas, MD  Multiple Vitamin (MULTIVITAMIN WITH MINERALS) TABS tablet Take 1 tablet by mouth daily.    [provider]  saccharomyces boulardii (FLORASTOR) 250 MG capsule Take 1 capsule (250 mg total) by mouth 2 (two) times daily. Patient not taking: Reported on 12/20/2018 08/20/17   Tobie Yetta HERO, MD  VIMPAT  100 MG TABS TAKE 1 TABLET BY MOUTH TWICE A DAY Patient not taking: Reported on 06/02/2017 02/28/17   Jenel Carlin POUR, MD    Allergies: Patient has no known allergies.    Review of Systems  Constitutional:  Negative for fever.  Respiratory:  Negative for shortness of breath, wheezing and  stridor.   Cardiovascular:  Positive for leg swelling. Negative for chest pain.  Musculoskeletal:  Negative for back pain.  All other systems reviewed and are negative.   Updated Vital Signs BP (!) 155/76   Pulse 100   Temp 99 F (37.2 C) (Oral)   Resp 12   SpO2 99%   Physical Exam Vitals and nursing note reviewed.  Constitutional:      General: He is not in acute distress.    Appearance: He is well-developed.  HENT:     Head: Normocephalic and atraumatic.  Cardiovascular:     Rate and Rhythm: Normal rate and regular rhythm.     Pulses: Normal pulses.     Heart sounds: Normal heart sounds.  Pulmonary:     Effort: Pulmonary effort is normal. No respiratory distress.     Breath sounds: Normal breath sounds.  Abdominal:     General: Bowel sounds are normal. There is no distension.     Palpations: Abdomen is soft. There is no mass.     Tenderness: There is no abdominal tenderness. There is no guarding or rebound.  Genitourinary:    Comments: No CVA tenderness Musculoskeletal:        General: Normal range of motion.     Cervical back: Normal range of motion.       Legs:  Skin:    General: Skin is  warm and dry.     Findings: No rash.  Neurological:     Mental Status: He is alert and oriented to person, place, and time.  Psychiatric:        Behavior: Behavior normal.     (all labs ordered are listed, but only abnormal results are displayed) Results for orders placed or performed during the hospital encounter of 11/23/23  Basic metabolic panel   Collection Time: 11/23/23 10:52 PM  Result Value Ref Range   Sodium 140 135 - 145 mmol/L   Potassium 4.2 3.5 - 5.1 mmol/L   Chloride 108 98 - 111 mmol/L   CO2 25 22 - 32 mmol/L   Glucose, Bld 104 (H) 70 - 99 mg/dL   BUN 25 (H) 6 - 20 mg/dL   Creatinine, Ser 8.85 0.61 - 1.24 mg/dL   Calcium 8.8 (L) 8.9 - 10.3 mg/dL   GFR, Estimated >39 >39 mL/min   Anion gap 7 5 - 15  CBC with Differential/Platelet   Collection Time:  11/24/23  1:38 AM  Result Value Ref Range   WBC 9.8 4.0 - 10.5 K/uL   RBC 5.15 4.22 - 5.81 MIL/uL   Hemoglobin 14.1 13.0 - 17.0 g/dL   HCT 55.0 60.9 - 47.9 %   MCV 87.2 80.0 - 100.0 fL   MCH 27.4 26.0 - 34.0 pg   MCHC 31.4 30.0 - 36.0 g/dL   RDW 86.2 88.4 - 84.4 %   Platelets 198 150 - 400 K/uL   nRBC 0.0 0.0 - 0.2 %   Neutrophils Relative % 81 %   Neutro Abs 7.9 (H) 1.7 - 7.7 K/uL   Lymphocytes Relative 14 %   Lymphs Abs 1.4 0.7 - 4.0 K/uL   Monocytes Relative 5 %   Monocytes Absolute 0.5 0.1 - 1.0 K/uL   Eosinophils Relative 0 %   Eosinophils Absolute 0.0 0.0 - 0.5 K/uL   Basophils Relative 0 %   Basophils Absolute 0.0 0.0 - 0.1 K/uL   Immature Granulocytes 0 %   Abs Immature Granulocytes 0.03 0.00 - 0.07 K/uL   No results found.  None  Radiology: No results found.   Procedures   Medications Ordered in the ED  enoxaparin  (LOVENOX ) injection 120 mg (has no administration in time range)  doxycycline  (VIBRA -TABS) tablet 100 mg (100 mg Oral Given 11/24/23 0401)                                    Medical Decision Making Swelling of the LLE  Amount and/or Complexity of Data Reviewed External Data Reviewed: notes.    Details: Previous notes reviewed  Labs: ordered.    Details: Normal sodium 140, normal potassium 4.2, normal creatinine.  Normal white count 9.8, normal hemoglobin 14.1   Risk Prescription drug management. Risk Details: Well appearing.  Have started doxycycline .  Given h/o DVT one dose of lovenox  given and DVT study set up for am at [facility].  Stable for discharge.  Strict returns.       Final diagnoses:  Cellulitis, unspecified cellulitis site   No signs of systemic illness or infection. The patient is nontoxic-appearing on exam and vital signs are within normal limits.  I have reviewed the triage vital signs and the nursing notes. Pertinent labs & imaging results that were available during my care of the patient were reviewed by me and considered in  my medical decision making (see  chart for details). After history, exam, and medical workup I feel the patient has been appropriately medically screened and is safe for discharge home. Pertinent diagnoses were discussed with the patient. Patient was given return precautions.     ED Discharge Orders          Ordered    LE Venous       Comments: IMPORTANT PATIENT INSTRUCTIONS: You have been scheduled for an Outpatient Vascular Study at [facility].  If tomorrow is a Saturday, Sunday or holiday, please go to the [facility] Registration Desk at 11 am tomorrow morning and tell them you are there for a vascular study.  If tomorrow is a weekday (Monday-Friday), please go to the [facility] (address [address], [place]) at 8 am and report to the 4th floor registration Zone A.  Inform registration that you are there for a vascular study.   11/24/23 0351    doxycycline (VIBRAMYCIN) 100 MG capsule  2 times daily        07 /18/25 0352               Yaiden Yang, MD 11/24/23 [phone]

## 2023-11-25 ENCOUNTER — Encounter (HOSPITAL_COMMUNITY)

## 2023-11-27 ENCOUNTER — Ambulatory Visit (HOSPITAL_COMMUNITY)
Admission: RE | Admit: 2023-11-27 | Discharge: 2023-11-27 | Disposition: A | Discharge status: Home / Self Care | Source: Ambulatory Visit | Attending: Emergency Medicine | Admitting: Emergency Medicine

## 2023-11-27 DIAGNOSIS — R600 Localized edema: Secondary | ICD-10-CM | POA: Diagnosis present

## 2023-11-27 DIAGNOSIS — M7989 Other specified soft tissue disorders: Secondary | ICD-10-CM | POA: Diagnosis not present

## 2023-11-27 DIAGNOSIS — Z86718 Personal history of other venous thrombosis and embolism: Secondary | ICD-10-CM | POA: Diagnosis not present
